# Patient Record
Sex: Female | Born: 1955 | Race: White | Hispanic: No | Marital: Married | Smoking: Never smoker
Health system: Southern US, Community
[De-identification: ages and names within clinical notes are randomized; demographics above are authoritative.]

## PROBLEM LIST (undated history)

## (undated) DIAGNOSIS — D4860 Neoplasm of uncertain behavior of unspecified breast: Secondary | ICD-10-CM

## (undated) DIAGNOSIS — G709 Myoneural disorder, unspecified: Secondary | ICD-10-CM

## (undated) DIAGNOSIS — G4733 Obstructive sleep apnea (adult) (pediatric): Secondary | ICD-10-CM

## (undated) DIAGNOSIS — F419 Anxiety disorder, unspecified: Secondary | ICD-10-CM

## (undated) DIAGNOSIS — N63 Unspecified lump in unspecified breast: Secondary | ICD-10-CM

## (undated) DIAGNOSIS — I10 Essential (primary) hypertension: Secondary | ICD-10-CM

## (undated) DIAGNOSIS — M1710 Unilateral primary osteoarthritis, unspecified knee: Secondary | ICD-10-CM

## (undated) DIAGNOSIS — R51 Headache: Secondary | ICD-10-CM

## (undated) DIAGNOSIS — M1990 Unspecified osteoarthritis, unspecified site: Secondary | ICD-10-CM

## (undated) DIAGNOSIS — M1711 Unilateral primary osteoarthritis, right knee: Secondary | ICD-10-CM

## (undated) DIAGNOSIS — N6452 Nipple discharge: Secondary | ICD-10-CM

## (undated) DIAGNOSIS — Z96652 Presence of left artificial knee joint: Secondary | ICD-10-CM

## (undated) DIAGNOSIS — E039 Hypothyroidism, unspecified: Secondary | ICD-10-CM

## (undated) DIAGNOSIS — R519 Headache, unspecified: Secondary | ICD-10-CM

## (undated) DIAGNOSIS — L409 Psoriasis, unspecified: Secondary | ICD-10-CM

## (undated) DIAGNOSIS — M1712 Unilateral primary osteoarthritis, left knee: Secondary | ICD-10-CM

## (undated) DIAGNOSIS — M797 Fibromyalgia: Secondary | ICD-10-CM

## (undated) HISTORY — PX: EYE SURGERY: SHX253

## (undated) HISTORY — DX: Unspecified osteoarthritis, unspecified site: M19.90

## (undated) HISTORY — DX: Unspecified lump in unspecified breast: N63.0

## (undated) HISTORY — DX: Essential (primary) hypertension: I10

## (undated) HISTORY — PX: BREAST BIOPSY: SHX20

## (undated) HISTORY — DX: Neoplasm of uncertain behavior of unspecified breast: D48.60

## (undated) SURGERY — Surgical Case
Anesthesia: *Unknown

---

## 1992-11-09 HISTORY — PX: CHOLECYSTECTOMY: SHX55

## 1997-11-09 HISTORY — PX: BREAST SURGERY: SHX581

## 1998-11-09 DIAGNOSIS — I10 Essential (primary) hypertension: Secondary | ICD-10-CM

## 1998-11-09 HISTORY — DX: Essential (primary) hypertension: I10

## 2004-11-09 HISTORY — PX: ENDOMETRIAL ABLATION: SHX621

## 2005-04-22 ENCOUNTER — Ambulatory Visit: Payer: Self-pay | Admitting: Orthopaedic Surgery

## 2005-06-04 ENCOUNTER — Other Ambulatory Visit: Payer: Self-pay

## 2005-06-09 ENCOUNTER — Ambulatory Visit: Payer: Self-pay | Admitting: Orthopaedic Surgery

## 2006-04-02 ENCOUNTER — Ambulatory Visit: Payer: Self-pay | Admitting: Internal Medicine

## 2006-11-09 DIAGNOSIS — M1990 Unspecified osteoarthritis, unspecified site: Secondary | ICD-10-CM

## 2006-11-09 HISTORY — DX: Unspecified osteoarthritis, unspecified site: M19.90

## 2008-03-07 ENCOUNTER — Ambulatory Visit: Payer: Self-pay | Admitting: General Practice

## 2008-11-21 ENCOUNTER — Ambulatory Visit: Payer: Self-pay | Admitting: Internal Medicine

## 2009-11-26 ENCOUNTER — Ambulatory Visit: Payer: Self-pay | Admitting: Internal Medicine

## 2009-12-31 ENCOUNTER — Ambulatory Visit: Payer: Self-pay | Admitting: Internal Medicine

## 2010-01-21 ENCOUNTER — Encounter: Payer: Self-pay | Admitting: Internal Medicine

## 2010-02-07 ENCOUNTER — Encounter: Payer: Self-pay | Admitting: Internal Medicine

## 2010-03-09 ENCOUNTER — Encounter: Payer: Self-pay | Admitting: Internal Medicine

## 2010-05-17 ENCOUNTER — Inpatient Hospital Stay: Payer: Self-pay | Admitting: Internal Medicine

## 2011-01-05 ENCOUNTER — Ambulatory Visit: Payer: Self-pay | Admitting: Internal Medicine

## 2012-09-08 ENCOUNTER — Ambulatory Visit: Payer: Self-pay | Admitting: Gastroenterology

## 2012-09-08 HISTORY — PX: COLONOSCOPY: SHX174

## 2012-11-09 HISTORY — PX: BREAST BIOPSY: SHX20

## 2012-11-09 HISTORY — PX: BREAST SURGERY: SHX581

## 2012-12-27 ENCOUNTER — Ambulatory Visit: Payer: Self-pay | Admitting: Physician Assistant

## 2013-01-10 ENCOUNTER — Ambulatory Visit: Payer: Self-pay | Admitting: Anesthesiology

## 2013-01-13 ENCOUNTER — Ambulatory Visit: Payer: Self-pay | Admitting: General Surgery

## 2013-02-08 ENCOUNTER — Telehealth: Payer: Self-pay | Admitting: General Surgery

## 2013-02-08 NOTE — Telephone Encounter (Addendum)
Melissa Hunt has requested a "re-read" of her slides by a second pathologist.  She works at [employer] and understands that it was [employer] folks at the hospital who read them, but would still like a second opinion.  I told her I would let you know and either you or I would get back to her.   I placed a return call to [name] on 02-10-13 at 9:00 a.m.  Left message on her voicemail to call office.  [name] requested that Dr. Meridee Score take another look.  [name] called it a QA.  I told her that I would let you know and if there was a problem, we would get back in touch with her.

## 2013-02-09 NOTE — Telephone Encounter (Signed)
Called pt, got voicemail, left message to call back. Her path slides were reviewed by other pathologists at [facility]- this their practice. If pt requests review from other location would like to know where.Melissa Hunt

## 2013-02-13 ENCOUNTER — Ambulatory Visit: Payer: Self-pay | Admitting: Internal Medicine

## 2013-02-14 ENCOUNTER — Telehealth: Payer: Self-pay | Admitting: *Deleted

## 2013-02-14 NOTE — Telephone Encounter (Signed)
Left message for patient to call office.  Per Dr. Evette Cristal, there are 4 [facility] contracted pathologist that have looked at her specimen and all agree with the same diagnosis.

## 2013-03-06 ENCOUNTER — Other Ambulatory Visit: Payer: Self-pay | Admitting: *Deleted

## 2013-03-06 ENCOUNTER — Encounter: Payer: Self-pay | Admitting: General Surgery

## 2013-03-06 ENCOUNTER — Ambulatory Visit (INDEPENDENT_AMBULATORY_CARE_PROVIDER_SITE_OTHER): Payer: 59 | Admitting: General Surgery

## 2013-03-06 VITALS — BP 126/62 | HR 68 | Resp 16 | Ht 64.0 in | Wt 240.0 lb

## 2013-03-06 DIAGNOSIS — D4860 Neoplasm of uncertain behavior of unspecified breast: Secondary | ICD-10-CM | POA: Insufficient documentation

## 2013-03-06 DIAGNOSIS — D4861 Neoplasm of uncertain behavior of right breast: Secondary | ICD-10-CM

## 2013-03-06 MED ORDER — TAMOXIFEN CITRATE 20 MG PO TABS: 20.0000 mg | ORAL_TABLET | Freq: Every day | ORAL | Status: DC

## 2013-03-06 NOTE — Progress Notes (Signed)
Patient ID: Melissa Hunt, female   DOB: May 12, 1956, 57 y.o.   MRN: [id number]  Chief Complaint  Patient presents with  . Follow-up    6 week follow up no mammogram    HPI Melissa Hunt is a 57 y.o. female here today following up from here 6 week left breast biopsy done on 01/13/13. Patient reports reduced swelling at biopsy site. Does report some mild soreness with pressure at this site.  HPI  Past Medical History  Diagnosis Date  . Arthritis 2008  . Hypertension 2000  . Neoplasm of uncertain behavior of breast   . Lump or mass in breast     Past Surgical History  Procedure Laterality Date  . Breast surgery Left 2014    breast biopsy  . Breast surgery Right 1999    excision of mass  . Endometrial ablation  2006  . Colonoscopy  2013  . Cholecystectomy  1994  . Cesarean section  1985    Family History  Problem Relation Age of Onset  . Cancer Father   . Cancer Paternal Aunt     liver  . Cancer Maternal Grandmother     lung    Social History History  Substance Use Topics  . Smoking status: Never Smoker   . Smokeless tobacco: Never Used  . Alcohol Use: 0.6 oz/week    1 Glasses of wine per week    No Known Allergies  Current Outpatient Prescriptions  Medication Sig Dispense Refill  . Clobetasol Propionate (TEMOVATE) 0.05 % external spray Apply topically 2 (two) times daily.      . cyclobenzaprine (FLEXERIL) 10 MG tablet Take 10 mg by mouth as needed for muscle spasms.      . DULoxetine (CYMBALTA) 60 MG capsule Take 60 mg by mouth daily.      Marland Kitchen levothyroxine (SYNTHROID, LEVOTHROID) 175 MCG tablet Take 175 mcg by mouth daily before breakfast.      . olmesartan (BENICAR) 20 MG tablet Take 20 mg by mouth daily.      . traZODone (DESYREL) 100 MG tablet Take 100 mg by mouth at bedtime.      . tamoxifen (NOLVADEX) 20 MG tablet Take 1 tablet (20 mg total) by mouth daily.  30 tablet  12   No current facility-administered medications for this visit.    Review of  Systems Review of Systems  Constitutional: Negative.   Respiratory: Negative.   Cardiovascular: Negative.     Blood pressure 126/62, pulse 68, resp. rate 16, height 5\' 4"  (1.626 m), weight 240 lb (108.863 kg).  Physical Exam Physical Exam  Constitutional: She appears well-developed and well-nourished.  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 Pt has decided to take this .        SANKAR,SEEPLAPUTHUR G 03/06/2013, 8:11 PM

## 2013-03-06 NOTE — Patient Instructions (Addendum)
Return in August 2014.

## 2013-03-06 NOTE — Progress Notes (Signed)
The patient has been asked to return to the office in four months for a unilateral left breast diagnostic mammogram.

## 2013-03-09 ENCOUNTER — Ambulatory Visit: Payer: Self-pay | Admitting: Internal Medicine

## 2013-06-27 ENCOUNTER — Ambulatory Visit: Payer: Self-pay | Admitting: General Surgery

## 2013-06-28 ENCOUNTER — Encounter: Payer: Self-pay | Admitting: General Surgery

## 2013-07-04 ENCOUNTER — Ambulatory Visit (INDEPENDENT_AMBULATORY_CARE_PROVIDER_SITE_OTHER): Payer: 59 | Admitting: General Surgery

## 2013-07-04 ENCOUNTER — Encounter: Payer: Self-pay | Admitting: General Surgery

## 2013-07-04 VITALS — BP 134/76 | HR 82 | Resp 12 | Wt 230.0 lb

## 2013-07-04 DIAGNOSIS — D4860 Neoplasm of uncertain behavior of unspecified breast: Secondary | ICD-10-CM

## 2013-07-04 DIAGNOSIS — D4862 Neoplasm of uncertain behavior of left breast: Secondary | ICD-10-CM

## 2013-07-04 MED ORDER — TAMOXIFEN CITRATE 20 MG PO TABS: 20.0000 mg | ORAL_TABLET | Freq: Every day | ORAL | Status: DC

## 2013-07-04 NOTE — Progress Notes (Signed)
Patient ID: Melissa Hunt, female   DOB: 28-Oct-1956, 57 y.o.   MRN: [id number]  No chief complaint on file.   HPI Melissa Hunt is a 57 y.o. female who presents for a breast evaluation.The most recent mammogram was done at [facility] on 06/27/13 it was a unilateral left. Patient does perform regular Hunt breast checks and gets regular mammograms done. Patient had a left breast biopsy done on 01/13/13. 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  She has noticed that she has been bruising easily for the past 5 months. No reports of excessive bleeding. She also reports hot flashes.  HPI  Past Medical History  Diagnosis Date  . Arthritis 2008  . Hypertension 2000  . Neoplasm of uncertain behavior of breast   . Lump or mass in breast     Past Surgical History  Procedure Laterality Date  . Endometrial ablation  2006  . Colonoscopy  2013  . Cholecystectomy  1994  . Cesarean section  1985  . Breast surgery Left 2014    breast biopsy  . Breast surgery Right 1999    excision of mass    Family History  Problem Relation Age of Onset  . Cancer Father   . Cancer Paternal Aunt     liver  . Cancer Maternal Grandmother     lung    Social History History  Substance Use Topics  . Smoking status: Never Smoker   . Smokeless tobacco: Never Used  . Alcohol Use: 0.6 oz/week    1 Glasses of wine per week    No Known Allergies  Current Outpatient Prescriptions  Medication Sig Dispense Refill  . Cholecalciferol (VITAMIN D3) 2000 UNITS TABS Take 1 capsule by mouth daily.      . Clobetasol Propionate (TEMOVATE) 0.05 % external spray Apply topically 2 (two) times daily.      . cyclobenzaprine (FLEXERIL) 10 MG tablet Take 10 mg by mouth as needed for muscle spasms.      . DULoxetine (CYMBALTA) 60 MG capsule Take 60 mg by mouth daily.      . hydrochlorothiazide (HYDRODIURIL) 25 MG tablet       . levothyroxine (SYNTHROID, LEVOTHROID) 175 MCG tablet Take 175 mcg by mouth daily before breakfast.       . olmesartan (BENICAR) 20 MG tablet Take 20 mg by mouth daily.      . tamoxifen (NOLVADEX) 20 MG tablet Take 1 tablet (20 mg total) by mouth daily.  30 tablet  12  . traZODone (DESYREL) 100 MG tablet Take 100 mg by mouth at bedtime.      . tamoxifen (NOLVADEX) 20 MG tablet Take 1 tablet (20 mg total) by mouth daily.  90 tablet  4   No current facility-administered medications for this visit.    Review of Systems Review of Systems  Constitutional: Negative.   Respiratory: Negative.   Cardiovascular: Negative.     Blood pressure 134/76, pulse 82, resp. rate 12, weight 230 lb (104.327 kg).  Physical Exam Physical Exam  Constitutional: She is oriented to person, place, and time. She appears well-developed and well-nourished.  Eyes: Conjunctivae are normal. No scleral icterus.  Neck: Neck supple.  Cardiovascular: Normal rate, regular rhythm and normal heart sounds.   Pulmonary/Chest: Effort normal and breath sounds normal. Right breast exhibits no inverted nipple, no mass, no nipple discharge, no skin change and no tenderness. Left breast exhibits no inverted nipple, no mass, no nipple discharge, no skin change and no tenderness.  Abdominal:  Soft. There is hepatosplenomegaly. There is no tenderness.  Lymphadenopathy:    She has no cervical adenopathy.    She has no axillary adenopathy.  Neurological: She is alert and oriented to person, place, and time.    Data Reviewed Mammogram left stable  Assessment    ADH, no new findings     Plan    To continue Tamoxifen, new Rx sent        [facility] G 07/04/2013, 8:48 PM

## 2013-12-28 ENCOUNTER — Ambulatory Visit: Payer: Self-pay | Admitting: General Surgery

## 2013-12-29 ENCOUNTER — Encounter: Payer: Self-pay | Admitting: General Surgery

## 2014-01-01 ENCOUNTER — Ambulatory Visit: Payer: 59 | Admitting: General Surgery

## 2014-01-09 ENCOUNTER — Ambulatory Visit (INDEPENDENT_AMBULATORY_CARE_PROVIDER_SITE_OTHER): Payer: 59 | Admitting: General Surgery

## 2014-01-09 ENCOUNTER — Encounter: Payer: Self-pay | Admitting: General Surgery

## 2014-01-09 VITALS — BP 122/82 | HR 68 | Resp 12 | Ht 64.0 in | Wt 222.0 lb

## 2014-01-09 DIAGNOSIS — N6089 Other benign mammary dysplasias of unspecified breast: Secondary | ICD-10-CM

## 2014-01-09 DIAGNOSIS — N6099 Unspecified benign mammary dysplasia of unspecified breast: Secondary | ICD-10-CM

## 2014-01-09 NOTE — Progress Notes (Signed)
Patient ID: Melissa Hunt, female   DOB: 06/02/1956, 58 y.o.   MRN: [id number]  Chief Complaint  Patient presents with  . Follow-up    6 month follow up bilateral diagnostic mammogram    HPI Melissa Hunt is a 58 y.o. female who presents for a breast evaluation. The most recent mammogram was done on 12/28/13. Patient does perform regular self breast checks and gets regular mammograms done. The patient denies any new problems at this time with her breasts.   Patient is one year post excision of ADH.   HPI  Past Medical History  Diagnosis Date  . Arthritis 2008  . Hypertension 2000  . Neoplasm of uncertain behavior of breast   . Lump or mass in breast     Past Surgical History  Procedure Laterality Date  . Endometrial ablation  2006  . Colonoscopy  2013  . Cholecystectomy  1994  . Cesarean section  1985  . Breast surgery Left 2014    breast biopsy  . Breast surgery Right 1999    excision of mass    Family History  Problem Relation Age of Onset  . Cancer Father   . Cancer Paternal Aunt     liver  . Cancer Maternal Grandmother     lung    Social History History  Substance Use Topics  . Smoking status: Never Smoker   . Smokeless tobacco: Never Used  . Alcohol Use: 0.6 oz/week    1 Glasses of wine per week    No Known Allergies  Current Outpatient Prescriptions  Medication Sig Dispense Refill  . Cholecalciferol (VITAMIN D3) 2000 UNITS TABS Take 1 capsule by mouth daily.      . Clobetasol Propionate (TEMOVATE) 0.05 % external spray Apply topically 2 (two) times daily.      . cyclobenzaprine (FLEXERIL) 10 MG tablet Take 10 mg by mouth as needed for muscle spasms.      . DULoxetine (CYMBALTA) 60 MG capsule Take 60 mg by mouth daily.      . hydrochlorothiazide (HYDRODIURIL) 25 MG tablet       . levothyroxine (SYNTHROID, LEVOTHROID) 175 MCG tablet Take 175 mcg by mouth daily before breakfast.      . olmesartan (BENICAR) 20 MG tablet Take 20 mg by mouth daily.      .  tamoxifen (NOLVADEX) 20 MG tablet Take 1 tablet (20 mg total) by mouth daily.  30 tablet  12  . traZODone (DESYREL) 100 MG tablet Take 100 mg by mouth at bedtime.       No current facility-administered medications for this visit.    Review of Systems Review of Systems  Constitutional: Negative.   Respiratory: Negative.   Cardiovascular: Negative.     Blood pressure 122/82, pulse 68, resp. rate 12, height 5\' 4"  (1.626 m), weight 222 lb (100.699 kg).  Physical Exam Physical Exam  Constitutional: She is oriented to person, place, and time. She appears well-developed and well-nourished.  Eyes: No scleral icterus.  Neck: Neck supple.  Cardiovascular: Normal rate, regular rhythm and normal heart sounds.   Pulmonary/Chest: Effort normal and breath sounds normal. Right breast exhibits no inverted nipple, no mass, no nipple discharge, no skin change and no tenderness. Left breast exhibits no inverted nipple, no mass, no nipple discharge, no skin change and no tenderness.  Lymphadenopathy:    She has no cervical adenopathy.    She has no axillary adenopathy.  Neurological: She is alert and oriented to person, place, and time.  Skin: Skin is warm and dry.    Data Reviewed Mammogram reviewed and stable  Assessment    Stable physical exam. ADH, now on Tamoxifen.    Plan    Follow up in one year bilateral screening mammogram and office visit.       Trine Fread G 01/11/2014, 5:58 AM

## 2014-01-09 NOTE — Patient Instructions (Signed)
Continue self breast exams. Call office for any new breast issues or concerns. 

## 2014-01-11 ENCOUNTER — Encounter: Payer: Self-pay | Admitting: General Surgery

## 2014-01-11 DIAGNOSIS — N6099 Unspecified benign mammary dysplasia of unspecified breast: Secondary | ICD-10-CM | POA: Insufficient documentation

## 2014-06-21 ENCOUNTER — Other Ambulatory Visit: Payer: Self-pay | Admitting: General Surgery

## 2014-09-10 ENCOUNTER — Encounter: Payer: Self-pay | Admitting: General Surgery

## 2014-12-31 ENCOUNTER — Ambulatory Visit: Payer: Self-pay | Admitting: Internal Medicine

## 2015-01-04 ENCOUNTER — Encounter: Payer: Self-pay | Admitting: Podiatry

## 2015-01-04 ENCOUNTER — Ambulatory Visit (INDEPENDENT_AMBULATORY_CARE_PROVIDER_SITE_OTHER): Payer: Commercial Managed Care - PPO | Admitting: Podiatry

## 2015-01-04 ENCOUNTER — Ambulatory Visit (INDEPENDENT_AMBULATORY_CARE_PROVIDER_SITE_OTHER): Payer: Commercial Managed Care - PPO

## 2015-01-04 VITALS — BP 120/75 | HR 93 | Resp 16

## 2015-01-04 DIAGNOSIS — M898X9 Other specified disorders of bone, unspecified site: Secondary | ICD-10-CM

## 2015-01-04 DIAGNOSIS — M2040 Other hammer toe(s) (acquired), unspecified foot: Secondary | ICD-10-CM

## 2015-01-04 DIAGNOSIS — L84 Corns and callosities: Secondary | ICD-10-CM

## 2015-01-04 NOTE — Progress Notes (Signed)
   Subjective:    Patient ID: Melissa Hunt, female    DOB: 18-May-1956, 59 y.o.   MRN: [id number]  HPI Comments: "I think I have a wart"  Patient c/o tender 4th toe left for several months. The area is callused. No home treatment.  Also, callused areas sub 5th MPJ bilateral and medial 1st toes bilateral   Toe Pain       Review of Systems  All other systems reviewed and are negative.      Objective:   Physical Exam        Assessment & Plan:

## 2015-01-05 NOTE — Progress Notes (Signed)
Subjective:     Patient ID: Melissa Hunt, female   DOB: 01-18-1956, 59 y.o.   MRN: [id number]  HPI patient states she's had a lot of problems with the end of the fourth toe left being painful and also the fourth toe left and has lesions underneath the fifth metatarsals of both feet which bother her   Review of Systems  All other systems reviewed and are negative.      Objective:   Physical Exam  Constitutional: She is oriented to person, place, and time.  Cardiovascular: Intact distal pulses.   Musculoskeletal: Normal range of motion.  Neurological: She is oriented to person, place, and time.  Skin: Skin is warm.  Nursing note and vitals reviewed.  neurovascular status intact with muscle strength adequate and range of motion subtalar midtarsal joint within normal limits. Patient's noted to have distal keratotic lesion fourth toe left that is painful when pressed and is noted to have keratotic lesions fifth digit 2 it's and fifth metatarsal of both feet the lesions on the fifth metatarsal being painful. 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 Debrided lesions today and explained that this will be done routinely and that if the symptoms get too bad we will need to consider surgical intervention in this particular case

## 2015-01-08 ENCOUNTER — Encounter: Payer: Self-pay | Admitting: General Surgery

## 2015-01-08 ENCOUNTER — Ambulatory Visit (INDEPENDENT_AMBULATORY_CARE_PROVIDER_SITE_OTHER): Payer: Commercial Managed Care - PPO | Admitting: General Surgery

## 2015-01-08 VITALS — BP 122/68 | HR 80 | Resp 12 | Ht 64.0 in | Wt 222.0 lb

## 2015-01-08 DIAGNOSIS — N62 Hypertrophy of breast: Secondary | ICD-10-CM

## 2015-01-08 DIAGNOSIS — N6099 Unspecified benign mammary dysplasia of unspecified breast: Secondary | ICD-10-CM

## 2015-01-08 NOTE — Patient Instructions (Addendum)
The patient has been asked to return to the office in one year with a bilateral screening mammogram. Continue self breast exams. Call office for any new breast issues or concerns.  

## 2015-01-08 NOTE — Progress Notes (Signed)
Patient ID: Melissa Hunt, female   DOB: 19-Jun-1956, 59 y.o.   MRN: [id number]  Chief Complaint  Patient presents with  . Follow-up    mammogram    HPI Melissa Hunt is a 59 y.o. female who presents for a breast evaluation. The most recent mammogram was done on 12/31/14 .  Patient does perform regular self breast checks and gets regular mammograms done.  No new breast complaints. 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  HPI  Past Medical History  Diagnosis Date  . Arthritis 2008  . Hypertension 2000  . Neoplasm of uncertain behavior of breast   . Lump or mass in breast     Past Surgical History  Procedure Laterality Date  . Endometrial ablation  2006  . Colonoscopy  2013  . Cholecystectomy  1994  . Cesarean section  1985  . Breast surgery Left 2014    breast biopsy/Atypical ductal hyperplasia of breast   . Breast surgery Right 1999    excision of mass    Family History  Problem Relation Age of Onset  . Cancer Father   . Cancer Paternal Aunt     liver  . Cancer Maternal Grandmother     lung    Social History History  Substance Use Topics  . Smoking status: Never Smoker   . Smokeless tobacco: Never Used  . Alcohol Use: 0.6 oz/week    1 Glasses of wine per week    No Known Allergies  Current Outpatient Prescriptions  Medication Sig Dispense Refill  . Cholecalciferol (VITAMIN D3) 2000 UNITS TABS Take 1 capsule by mouth daily.    Marland Kitchen levothyroxine (SYNTHROID, LEVOTHROID) 175 MCG tablet Take 175 mcg by mouth daily before breakfast.    . olmesartan (BENICAR) 20 MG tablet Take 20 mg by mouth daily.    . tamoxifen (NOLVADEX) 20 MG tablet Take 1 tablet by mouth  daily 90 tablet 4   No current facility-administered medications for this visit.    Review of Systems Review of Systems  Constitutional: Negative.   Respiratory: Negative.   Cardiovascular: Negative.     Blood pressure 122/68, pulse 80, resp. rate 12, height 5\' 4"   (1.626 m), weight 222 lb (100.699 kg).  Physical Exam Physical Exam  Constitutional: She is oriented to person, place, and time. She appears well-developed and well-nourished.  Eyes: Conjunctivae are normal. No scleral icterus.  Neck: Neck supple.  Cardiovascular: Normal rate, regular rhythm and normal heart sounds.   Pulmonary/Chest: Effort normal and breath sounds normal. Right breast exhibits no inverted nipple, no mass, no nipple discharge, no skin change and no tenderness. Left breast exhibits no inverted nipple, no mass, no nipple discharge, no skin change and no tenderness.  Abdominal: Soft. Bowel sounds are normal. There is no tenderness.  Lymphadenopathy:    She has no cervical adenopathy.    She has no axillary adenopathy.  Neurological: She is alert and oriented to person, place, and time.  Skin: Skin is warm and dry.    Data Reviewed Mammogram reviewed and stable  Assessment    Stable exam. ADH left breast 32yrs post excision. On preventive Tamoxifen.    Plan    The patient has been asked to return to the office in one year with a bilateral screening mammogram.        Joaquin Knebel G 01/09/2015, 1:17 PM

## 2015-01-09 ENCOUNTER — Encounter: Payer: Self-pay | Admitting: General Surgery

## 2015-03-01 NOTE — Op Note (Signed)
PATIENT NAME:  BERRY, GALLACHER MR#:  [id number] DATE OF BIRTH:  [birth year]  DATE OF PROCEDURE:  01/13/2013  PREOPERATIVE DIAGNOSIS: Left nipple bloody discharge with associated subareolar mass.   POSTOPERATIVE DIAGNOSIS: Left nipple bloody discharge with associated subareolar mass.  OPERATION: Subareolar duct excision and ultrasound guidance intraoperatively.   SURGEON: S.G. Jamal Collin, MD   ANESTHESIA: General.   COMPLICATIONS: None.   ESTIMATED BLOOD LOSS: Minimal.   DRAINS: None.   DESCRIPTION OF PROCEDURE: The patient was put to sleep with an LMA, and the left breast was prepped and draped out as a sterile field. Ultrasound probe was brought up to the field, and the previously noted small hypoechoic area adjacent to the nipple was identified in the outer aspect about the 2 to 3 o'clock position. It was felt that this will be adequately included in the subareolar excision. A local anesthetic of 0.5% Marcaine was instilled, and incision was made from the 1 o'clock to the 4 o'clock position circumareolar. The areolar skin was lifted up all the way to the nipple, and the skin and subcutaneous tissue was also elevated on the lateral aspect. The subareolar tissue all the way up to the nipple area was then completely excised out. The excised tissue was queried with the ultrasound probe and showed the tiny hypoechoic area within the midst of it. Grossly, this was not a palpable mass. The rest of the underlying breast tissue appeared to be normal. After ensuring hemostasis, the deeper tissues were approximated with 2-0 Vicryl, and the skin closed with subcuticular 4-0 Vicryl, covered with Dermabond. The procedure was well tolerated. She was subsequently returned to the recovery room in stable condition.   ____________________________ S.Robinette Haines, MD sgs:OSi D: 01/14/2013 08:47:28 ET T: 01/14/2013 13:12:01 ET JOB#: [id number]  cc: Synthia Innocent. Jamal Collin, MD, <Dictator> [facility] Robinette Haines MD ELECTRONICALLY  SIGNED 01/15/2013 12:37

## 2015-07-22 DIAGNOSIS — E039 Hypothyroidism, unspecified: Secondary | ICD-10-CM | POA: Diagnosis present

## 2015-10-23 ENCOUNTER — Other Ambulatory Visit: Payer: Self-pay

## 2015-10-23 DIAGNOSIS — Z1231 Encounter for screening mammogram for malignant neoplasm of breast: Secondary | ICD-10-CM

## 2016-01-03 ENCOUNTER — Ambulatory Visit
Admission: RE | Admit: 2016-01-03 | Discharge: 2016-01-03 | Disposition: A | Discharge status: Home / Self Care | Payer: 59 | Source: Ambulatory Visit | Attending: General Surgery | Admitting: General Surgery

## 2016-01-03 DIAGNOSIS — Z1231 Encounter for screening mammogram for malignant neoplasm of breast: Secondary | ICD-10-CM | POA: Diagnosis not present

## 2016-01-08 ENCOUNTER — Ambulatory Visit: Payer: Commercial Managed Care - PPO | Admitting: General Surgery

## 2016-03-05 ENCOUNTER — Encounter: Payer: Self-pay | Admitting: *Deleted

## 2016-07-22 DIAGNOSIS — E782 Mixed hyperlipidemia: Secondary | ICD-10-CM | POA: Diagnosis present

## 2017-02-15 ENCOUNTER — Other Ambulatory Visit: Payer: Self-pay | Admitting: Internal Medicine

## 2017-02-15 DIAGNOSIS — N6452 Nipple discharge: Secondary | ICD-10-CM

## 2017-02-23 ENCOUNTER — Ambulatory Visit
Admission: RE | Admit: 2017-02-23 | Discharge: 2017-02-23 | Disposition: A | Discharge status: Home / Self Care | Payer: 59 | Source: Ambulatory Visit | Attending: Internal Medicine | Admitting: Internal Medicine

## 2017-02-23 DIAGNOSIS — N6452 Nipple discharge: Secondary | ICD-10-CM | POA: Insufficient documentation

## 2017-02-23 HISTORY — DX: Nipple discharge: N64.52

## 2017-02-25 ENCOUNTER — Other Ambulatory Visit: Payer: Self-pay | Admitting: Internal Medicine

## 2017-02-25 DIAGNOSIS — N6452 Nipple discharge: Secondary | ICD-10-CM

## 2017-03-05 ENCOUNTER — Ambulatory Visit
Admission: RE | Admit: 2017-03-05 | Discharge: 2017-03-05 | Disposition: A | Discharge status: Home / Self Care | Payer: 59 | Source: Ambulatory Visit | Attending: Internal Medicine | Admitting: Internal Medicine

## 2017-03-05 DIAGNOSIS — N6452 Nipple discharge: Secondary | ICD-10-CM | POA: Diagnosis not present

## 2017-03-05 MED ORDER — GADOBENATE DIMEGLUMINE 529 MG/ML IV SOLN
20.0000 mL | Freq: Once | INTRAVENOUS | Status: AC | PRN
  Administered 2017-03-05: 20 mL via INTRAVENOUS

## 2017-03-09 ENCOUNTER — Other Ambulatory Visit: Payer: Self-pay | Admitting: Internal Medicine

## 2017-03-09 DIAGNOSIS — N63 Unspecified lump in unspecified breast: Secondary | ICD-10-CM

## 2017-03-10 ENCOUNTER — Other Ambulatory Visit: Payer: Self-pay | Admitting: Internal Medicine

## 2017-03-10 DIAGNOSIS — N63 Unspecified lump in unspecified breast: Secondary | ICD-10-CM

## 2017-03-10 DIAGNOSIS — N6452 Nipple discharge: Secondary | ICD-10-CM

## 2017-03-12 ENCOUNTER — Ambulatory Visit
Admission: RE | Admit: 2017-03-12 | Discharge: 2017-03-12 | Disposition: A | Discharge status: Home / Self Care | Payer: 59 | Source: Ambulatory Visit | Attending: Internal Medicine | Admitting: Internal Medicine

## 2017-03-12 DIAGNOSIS — N6452 Nipple discharge: Secondary | ICD-10-CM

## 2017-03-12 DIAGNOSIS — N6012 Diffuse cystic mastopathy of left breast: Secondary | ICD-10-CM | POA: Diagnosis not present

## 2017-03-12 DIAGNOSIS — N6489 Other specified disorders of breast: Secondary | ICD-10-CM | POA: Diagnosis not present

## 2017-03-12 DIAGNOSIS — N63 Unspecified lump in unspecified breast: Secondary | ICD-10-CM

## 2017-03-12 HISTORY — PX: BREAST BIOPSY: SHX20

## 2017-03-12 MED ORDER — GADOBENATE DIMEGLUMINE 529 MG/ML IV SOLN
20.0000 mL | Freq: Once | INTRAVENOUS | Status: AC | PRN
  Administered 2017-03-12: 20 mL via INTRAVENOUS

## 2017-03-24 DIAGNOSIS — N6012 Diffuse cystic mastopathy of left breast: Secondary | ICD-10-CM | POA: Diagnosis not present

## 2017-03-24 DIAGNOSIS — N6452 Nipple discharge: Secondary | ICD-10-CM | POA: Diagnosis not present

## 2017-05-13 DIAGNOSIS — M25561 Pain in right knee: Secondary | ICD-10-CM | POA: Diagnosis not present

## 2017-05-13 DIAGNOSIS — M25562 Pain in left knee: Secondary | ICD-10-CM | POA: Diagnosis not present

## 2017-05-13 DIAGNOSIS — M170 Bilateral primary osteoarthritis of knee: Secondary | ICD-10-CM | POA: Diagnosis not present

## 2017-05-20 DIAGNOSIS — M170 Bilateral primary osteoarthritis of knee: Secondary | ICD-10-CM | POA: Diagnosis not present

## 2017-05-20 DIAGNOSIS — M160 Bilateral primary osteoarthritis of hip: Secondary | ICD-10-CM | POA: Diagnosis not present

## 2017-06-17 DIAGNOSIS — G5603 Carpal tunnel syndrome, bilateral upper limbs: Secondary | ICD-10-CM | POA: Diagnosis not present

## 2017-06-17 DIAGNOSIS — M47816 Spondylosis without myelopathy or radiculopathy, lumbar region: Secondary | ICD-10-CM | POA: Diagnosis not present

## 2017-06-17 DIAGNOSIS — M1711 Unilateral primary osteoarthritis, right knee: Secondary | ICD-10-CM | POA: Diagnosis not present

## 2017-06-24 DIAGNOSIS — M545 Low back pain: Secondary | ICD-10-CM | POA: Diagnosis not present

## 2017-06-30 DIAGNOSIS — M47816 Spondylosis without myelopathy or radiculopathy, lumbar region: Secondary | ICD-10-CM | POA: Diagnosis not present

## 2017-07-19 DIAGNOSIS — M545 Low back pain: Secondary | ICD-10-CM | POA: Diagnosis not present

## 2017-07-19 DIAGNOSIS — M5416 Radiculopathy, lumbar region: Secondary | ICD-10-CM | POA: Diagnosis not present

## 2017-07-28 DIAGNOSIS — Z0000 Encounter for general adult medical examination without abnormal findings: Secondary | ICD-10-CM | POA: Diagnosis not present

## 2017-07-28 DIAGNOSIS — N6452 Nipple discharge: Secondary | ICD-10-CM | POA: Diagnosis not present

## 2017-08-04 DIAGNOSIS — M1712 Unilateral primary osteoarthritis, left knee: Secondary | ICD-10-CM | POA: Diagnosis not present

## 2017-08-04 DIAGNOSIS — M5416 Radiculopathy, lumbar region: Secondary | ICD-10-CM | POA: Diagnosis not present

## 2017-08-09 ENCOUNTER — Encounter: Payer: Self-pay | Admitting: *Deleted

## 2017-08-09 ENCOUNTER — Ambulatory Visit: Payer: Commercial Managed Care - PPO | Admitting: General Surgery

## 2017-08-10 DIAGNOSIS — M25562 Pain in left knee: Secondary | ICD-10-CM | POA: Diagnosis not present

## 2017-08-11 ENCOUNTER — Inpatient Hospital Stay: Payer: Self-pay

## 2017-08-11 ENCOUNTER — Ambulatory Visit (INDEPENDENT_AMBULATORY_CARE_PROVIDER_SITE_OTHER): Payer: 59 | Admitting: General Surgery

## 2017-08-11 ENCOUNTER — Encounter: Payer: Self-pay | Admitting: General Surgery

## 2017-08-11 VITALS — BP 128/70 | HR 94 | Resp 14 | Ht 63.0 in | Wt 210.0 lb

## 2017-08-11 DIAGNOSIS — N6489 Other specified disorders of breast: Secondary | ICD-10-CM

## 2017-08-11 DIAGNOSIS — N62 Hypertrophy of breast: Secondary | ICD-10-CM

## 2017-08-11 DIAGNOSIS — N6452 Nipple discharge: Secondary | ICD-10-CM

## 2017-08-11 NOTE — Progress Notes (Signed)
Patient ID: Melissa Hunt, female   DOB: [birth year], 61 y.o.   MRN: [id number]  Chief Complaint  Patient presents with  . Follow-up    HPI Melissa Hunt is a 61 y.o. female.  who presents for a breast evaluation and second opinion. She states she saw Dr Viona Gilmore. Tamala Julian and he didn't feel anything needed to be done immediatly but she is concerned. He is a Industrial/product designer of hers. The most recent mammogram and biopsy was done on 03-12-17, showing Melissa Hunt Patient does perform regular self breast checks and gets regular mammograms done.   She states the left nipple started draining in February from 2 aresa. She states it started milky bloody in color now its clear. The patient underwent a left breast biopsy were nipple drainage in 2014 with findings of atypical ductal hyperplasia.  She was on Tamoxifen for chemoprevention for 2 years but stopped this medication over a year ago due to vasomotor symptoms. She last saw Dr Jamal Collin in 2016.  She has some orthopedic issues and using a cane today. 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  After the patient's 2014 biopsy she had contacted her treating surgeon at that time to be sure the slides had been reviewed by multiple pathologists.  HPI  Past Medical History:  Diagnosis Date  . Arthritis 2008  . Breast discharge 2 weeks ago   LEFT BREAST BLOODY  . Hypertension 2000  . Lump or mass in breast   . Neoplasm of uncertain behavior of breast     Past Surgical History:  Procedure Laterality Date  . BREAST BIOPSY Left 2014   neg, but took Tamoxifen 1 1/2 yrs  . BREAST BIOPSY Right    many years ago  . BREAST BIOPSY Left 03/12/2017   FIBROCYSTIC CHANGES WITH CALCIFICATIONS.[place]  . BREAST SURGERY Left 2014   breast biopsy/Atypical ductal hyperplasia of breast   . BREAST SURGERY Right 1999   excision of mass  . CESAREAN SECTION  1985  . CHOLECYSTECTOMY  1994  . COLONOSCOPY  2013   Dr Candace Cruise  . ENDOMETRIAL ABLATION  2006    Family History   Problem Relation Age of Onset  . Cancer Father 64       colon  . Cancer Paternal Aunt        liver  . Cancer Maternal Grandmother        lung  . Breast cancer Maternal Aunt   . Breast cancer Paternal Aunt     Social History Social History  Substance Use Topics  . Smoking status: Never Smoker  . Smokeless tobacco: Never Used  . Alcohol use 0.6 oz/week    1 Glasses of wine per week    No Known Allergies  Current Outpatient Prescriptions  Medication Sig Dispense Refill  . Cholecalciferol (VITAMIN D3) 2000 UNITS TABS Take 1 capsule by mouth daily.    Marland Kitchen FLUoxetine (PROZAC) 20 MG tablet Take 20 mg by mouth daily.    Marland Kitchen levothyroxine (SYNTHROID, LEVOTHROID) 150 MCG tablet Take 150 mcg by mouth daily before breakfast.     . olmesartan (BENICAR) 20 MG tablet Take 20 mg by mouth daily.    Marland Kitchen tiZANidine (ZANAFLEX) 4 MG tablet TAKE 1/2 TO 1 TABLET BY MOUTH TWICE A DAY AS NEEDED FOR SPASM (MAY CAUSE DROWSINESS) (MAIL REQ)  1  . traMADol (ULTRAM) 50 MG tablet Take 50 mg by mouth daily.      No current facility-administered medications for this visit.  Review of Systems Review of Systems  Constitutional: Negative.   Respiratory: Negative.   Cardiovascular: Negative.     Blood pressure 128/70, pulse 94, resp. rate 14, height '5\' 3"'  (1.6 m), weight 210 lb (95.3 kg).  Physical Exam Physical Exam  Constitutional: She is oriented to person, place, and time. She appears well-developed and well-nourished.  HENT:  Mouth/Throat: Oropharynx is clear and moist.  Eyes: Conjunctivae are normal. No scleral icterus.  Neck: Neck supple.  Cardiovascular: Normal rate, regular rhythm and normal heart sounds.   Pulmonary/Chest: Effort normal and breath sounds normal. Right breast exhibits no inverted nipple, no mass, no nipple discharge, no skin change and no tenderness. Left breast exhibits no inverted nipple, no mass, no nipple discharge, no skin change and no tenderness.    Well healed scar  left breast just outside areola margin  Lymphadenopathy:    She has no cervical adenopathy.    She has no axillary adenopathy.  Neurological: She is alert and oriented to person, place, and time.  Skin: Skin is warm and dry.  Psychiatric: Her behavior is normal.    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  - DUCT ECTASIA.  - FIBROCYSTIC CHANGES INCLUDING CYST FORMATION, APOCRINE  METAPLASIA, USUAL DUCTAL HYPERPLASIA, AND COLUMNAR CELL CHANGE.  Marland Kitchen  Comment  The margins are negative for ADH and FEA. ADH is 0.1 cm from  closest [facility] inked margin.  03/12/2017 MR directed breast biopsy at 9:00:  Breast, left, needle core biopsy, 8:30 o'clock - FIBROCYSTIC CHANGES WITH CALCIFICATIONS. - PSEUDOANGIOMATOUS STROMAL HYPERPLASIA ([place]). - THERE IS NO EVIDENCE OF MALIGNANCY.  Screening mammograms of 02/23/2017 as well as diagnostic mammograms of 03/05/2017 postbiopsy mammograms of 03/12/2017 were reviewed. There is been a focal density in the retroareolar area dated back several years that has undergone minimal change.  Postbiopsy images show the clip in the 9:00 position about 47 m from the nipple.  Ultrasound examination was completed today due to the patient's report of recurrent nipple drainage and the density noted on serial mammograms. In the retroareolar area at the 1:00 position 1 cm from the nipple there is a hypoechoic area measuring up to 0.67 cm without increased vascularity and without direct continuity with any ducts. No significant ductal dilatation. BI-RADS-4.  At the 9:00 position 5 synovator some nipple there is a hyperechoic area with posterior shadowing which may likely represent the previously placed biopsy clip. The said defect measures 0.347 m in maximal diameter. BI-RADS-2.  Assessment    Recurrent nipple discharge  status post left retromalleolar eyedrops he.  Recent MR directed biopsy with diagnosis unrelated to present clinical issues showing evidence of pseudo-angiomatous stromal hyperplasia. .    Plan    Recurrent nipple drainage for which exploration and excision of the retroareolar ductal structures reasonable.  The density noted on mammograms and ultrasounds will be resected at the same setting. This may be scar from her 2014 biopsy.  The option to formally excise the area of pseudo-angiomatous stromal hyperplasia at the same setting which would be best done with needle localization to confirm that the ultrasound area correlates with the mammographic imaging could be completed at the same time if she wants to wipe this late clean for any pathology in the breast. Pros and cons of all options reviewed.     The patient has a first-degree relative with colon cancer (last colonoscopy  2013) see completed by Verdie Shire, M.D. who is no longer in the area.  Second degree relative with breast cancer (maternal aunt) and paternal grandmother. She may qualify for genetic testing.    She wishes to have both areas removed Investigate BRCA testing  HPI, Physical Exam, Assessment and Plan have been scribed under the direction and in the presence of Robert Bellow, MD. Karie Fetch, RN  I have completed the exam and reviewed the above documentation for accuracy and completeness.  I agree with the above.  Haematologist has been used and any errors in dictation or transcription are unintentional.  Hervey Ard, M.D., F.A.C.S.   Robert Bellow 08/11/2017, 8:37 PM  Patient's surgery has been scheduled for 09-03-17 at [facility]   Dominga Ferry, CMA

## 2017-08-11 NOTE — Patient Instructions (Signed)
The patient is aware to call back for any questions or concerns.  

## 2017-08-12 ENCOUNTER — Other Ambulatory Visit: Payer: Self-pay | Admitting: General Surgery

## 2017-08-12 ENCOUNTER — Telehealth: Payer: Self-pay | Admitting: *Deleted

## 2017-08-12 DIAGNOSIS — M170 Bilateral primary osteoarthritis of knee: Secondary | ICD-10-CM | POA: Diagnosis not present

## 2017-08-12 DIAGNOSIS — N6452 Nipple discharge: Secondary | ICD-10-CM

## 2017-08-12 DIAGNOSIS — N6489 Other specified disorders of breast: Secondary | ICD-10-CM

## 2017-08-12 NOTE — Telephone Encounter (Signed)
Message left on cell phone for patient to call the office.   We need to review surgery date, arrival time, and instructions.

## 2017-08-12 NOTE — Telephone Encounter (Signed)
Patient called the office back to report that she needs to cancel surgery with Dr. Bary Castilla that was scheduled for 09-03-17 at [facility].   The patient states that she needs to have knee surgery and they had a cancellation for 09-06-17.   Patient declines having breast surgery prior to knee surgery. She does not wish to reschedule at this time and will call the office back when she would like to proceed.   Samantha in mammography has been notified of cancellation as well as the O. R.

## 2017-08-20 DIAGNOSIS — Z800 Family history of malignant neoplasm of digestive organs: Secondary | ICD-10-CM | POA: Insufficient documentation

## 2017-08-20 DIAGNOSIS — M76899 Other specified enthesopathies of unspecified lower limb, excluding foot: Secondary | ICD-10-CM | POA: Diagnosis not present

## 2017-08-20 DIAGNOSIS — Z01818 Encounter for other preprocedural examination: Secondary | ICD-10-CM | POA: Diagnosis not present

## 2017-08-25 NOTE — Pre-Procedure Instructions (Signed)
Melissa Hunt [facility]  08/25/2017      [id number] [address], [place] [address] Phone: [phone] Fax: [place], [place] [facility] [address] [place] [address] [place] [address] Phone: [phone] Fax: [phone]    Your procedure is scheduled on October 29  Report to [place] at [email or web address].M.  Call this number if you have problems the morning of surgery:  [phone]   Remember:  Do not eat food or drink liquids after midnight.  Continue all other medications as directed by your physician except follow these medication instructions before surgery   Take these medicines the morning of surgery with A SIP OF WATER  cyclobenzaprine (FLEXERIL) FLUoxetine (PROZAC)  levothyroxine (SYNTHROID, LEVOTHROID) traMADol (ULTRAM)   7 days prior to surgery STOP taking any Aspirin (unless otherwise instructed by your surgeon), Aleve, Naproxen, Ibuprofen, Motrin, Advil, Goody's, BC's, all herbal medications, fish oil, and all vitamins    Do not wear jewelry, make-up or nail polish.  Do not wear lotions, powders, or perfumes, or deoderant.  Do not shave 48 hours prior to surgery.  Do not bring valuables to the hospital.  [facility] is not responsible for any belongings or valuables.  Contacts, dentures or bridgework may not be worn into surgery.  Leave your suitcase in the car.  After surgery it may be brought to your room.  For patients admitted to the hospital, discharge time will be determined by your treatment team.  Patients discharged the day of surgery will not be allowed to drive home.   Special instructions:   [place]- Preparing For Surgery  Before surgery, you can play an important role. Because skin is not sterile, your skin needs to be as free of germs as possible. You can reduce the number of germs on your skin by washing with CHG  (chlorahexidine gluconate) Soap before surgery.  CHG is an antiseptic cleaner which kills germs and bonds with the skin to continue killing germs even after washing.  Please do not use if you have an allergy to CHG or antibacterial soaps. If your skin becomes reddened/irritated stop using the CHG.  Do not shave (including legs and underarms) for at least 48 hours prior to first CHG shower. It is OK to shave your face.  Please follow these instructions carefully.   1. Shower the NIGHT BEFORE SURGERY and the MORNING OF SURGERY with CHG.   2. If you chose to wash your hair, wash your hair first as usual with your normal shampoo.  3. After you shampoo, rinse your hair and body thoroughly to remove the shampoo.  4. Use CHG as you would any other liquid soap. You can apply CHG directly to the skin and wash gently with a scrungie or a clean washcloth.   5. Apply the CHG Soap to your body ONLY FROM THE NECK DOWN.  Do not use on open wounds or open sores. Avoid contact with your eyes, ears, mouth and genitals (private parts). Wash Face and genitals (private parts)  with your normal soap.  6. Wash thoroughly, paying special attention to the area where your surgery will be performed.  7. Thoroughly rinse your body with warm water from the neck down.  8. DO NOT shower/wash with your normal soap after using and rinsing off the CHG Soap.  9. Pat yourself dry with a CLEAN TOWEL.  10. Wear CLEAN PAJAMAS to bed the night before surgery, wear comfortable clothes the morning of surgery  11. Place CLEAN SHEETS on your bed the night of your first shower and DO NOT SLEEP WITH PETS.    Day of Surgery: Do not apply any deodorants/lotions. Please wear clean clothes to the hospital/surgery center.      Please read over the following fact sheets that you were given.

## 2017-08-26 ENCOUNTER — Encounter (HOSPITAL_COMMUNITY)
Admission: RE | Admit: 2017-08-26 | Discharge: 2017-08-26 | Disposition: A | Discharge status: Home / Self Care | Payer: 59 | Source: Ambulatory Visit | Attending: Orthopedic Surgery | Admitting: Orthopedic Surgery

## 2017-08-26 ENCOUNTER — Other Ambulatory Visit: Payer: 59

## 2017-08-26 ENCOUNTER — Encounter (HOSPITAL_COMMUNITY): Payer: Self-pay

## 2017-08-26 DIAGNOSIS — Z01818 Encounter for other preprocedural examination: Secondary | ICD-10-CM | POA: Insufficient documentation

## 2017-08-26 HISTORY — DX: Myoneural disorder, unspecified: G70.9

## 2017-08-26 HISTORY — DX: Hypothyroidism, unspecified: E03.9

## 2017-08-26 HISTORY — DX: Anxiety disorder, unspecified: F41.9

## 2017-08-26 HISTORY — DX: Fibromyalgia: M79.7

## 2017-08-26 LAB — COMPREHENSIVE METABOLIC PANEL
ALT: 17 U/L (ref 14–54)
AST: 18 U/L (ref 15–41)
Albumin: 4.1 g/dL (ref 3.5–5.0)
Alkaline Phosphatase: 62 U/L (ref 38–126)
Anion gap: 8 (ref 5–15)
BUN: 17 mg/dL (ref 6–20)
CHLORIDE: 106 mmol/L (ref 101–111)
CO2: 24 mmol/L (ref 22–32)
CREATININE: 0.59 mg/dL (ref 0.44–1.00)
Calcium: 9.1 mg/dL (ref 8.9–10.3)
GFR calc Af Amer: >60 mL/min (ref >60)
GFR calc non Af Amer: >60 mL/min (ref >60)
GLUCOSE: 90 mg/dL (ref 65–99)
POTASSIUM: 3.9 mmol/L (ref 3.5–5.1)
SODIUM: 138 mmol/L (ref 135–145)
Total Bilirubin: 0.4 mg/dL (ref 0.3–1.2)
Total Protein: 6.7 g/dL (ref 6.5–8.1)

## 2017-08-26 LAB — PROTIME-INR
INR: 0.93
Prothrombin Time: 12.3 seconds (ref 11.4–15.2)

## 2017-08-26 LAB — CBC WITH DIFFERENTIAL/PLATELET
BASOS ABS: 0 10*3/uL (ref 0.0–0.1)
Basophils Relative: 0 %
EOS ABS: 0.2 10*3/uL (ref 0.0–0.7)
EOS PCT: 2 %
HCT: 42.5 % (ref 36.0–46.0)
Hemoglobin: 14.2 g/dL (ref 12.0–15.0)
Lymphocytes Relative: 30 %
Lymphs Abs: 2.1 10*3/uL (ref 0.7–4.0)
MCH: 31.1 pg (ref 26.0–34.0)
MCHC: 33.4 g/dL (ref 30.0–36.0)
MCV: 93.2 fL (ref 78.0–100.0)
Monocytes Absolute: 0.8 10*3/uL (ref 0.1–1.0)
Monocytes Relative: 12 %
Neutro Abs: 3.8 10*3/uL (ref 1.7–7.7)
Neutrophils Relative %: 56 %
PLATELETS: 225 10*3/uL (ref 150–400)
RBC: 4.56 MIL/uL (ref 3.87–5.11)
RDW: 13 % (ref 11.5–15.5)
WBC: 7 10*3/uL (ref 4.0–10.5)

## 2017-08-26 LAB — SURGICAL PCR SCREEN
MRSA, PCR: NEGATIVE
STAPHYLOCOCCUS AUREUS: NEGATIVE

## 2017-08-26 LAB — ABO/RH: ABO/RH(D): A NEG

## 2017-08-26 LAB — APTT: APTT: 29 s (ref 24–36)

## 2017-08-26 LAB — TYPE AND SCREEN
ABO/RH(D): A NEG
ANTIBODY SCREEN: NEGATIVE

## 2017-08-26 NOTE — Progress Notes (Addendum)
PCP is Dr. Emily Filbert States she saw a heart Dr back in 2011 since she was having chest pain, it turned out to be esophageal spasms.  Stress test noted under imaging in epic.  Denies ever having a card cath.  Copy of Ekg placed on her chart from Dr Sanjuan Dame office  Denies any chest pain, fever, or cough.

## 2017-08-27 LAB — URINE CULTURE

## 2017-08-30 NOTE — Progress Notes (Signed)
Message left with Sherri at Dr Penelope Coop office to inform of urine culture. Asked if we need to re collect day of surgery. To return call,

## 2017-08-31 DIAGNOSIS — M170 Bilateral primary osteoarthritis of knee: Secondary | ICD-10-CM | POA: Diagnosis not present

## 2017-08-31 NOTE — H&P (Signed)
TOTAL KNEE ADMISSION H&P  Patient is being admitted for left total knee arthroplasty.  Subjective:  Chief Complaint:left knee pain.  HPI: Melissa Hunt, 61 y.o. female, has a history of pain and functional disability in the left knee due to arthritis and has failed non-surgical conservative treatments for greater than 12 weeks to includeNSAID's and/or analgesics, corticosteriod injections, viscosupplementation injections, flexibility and strengthening excercises and supervised PT with diminished ADL's post treatment.  Onset of symptoms was gradual, starting 10 years ago with gradually worsening course since that time. The patient noted prior procedures on the knee to include  arthroscopy and menisectomy on the left knee(s).  Patient currently rates pain in the left knee(s) at 10 out of 10 with activity. Patient has night pain, worsening of pain with activity and weight bearing and pain that interferes with activities of daily living.  Patient has evidence of subchondral sclerosis, periarticular osteophytes and joint space narrowing by imaging studies. . There is no active infection.  Patient Active Problem List   Diagnosis Date Noted  . Nipple discharge 08/11/2017  . Pseudoangiomatous stromal hyperplasia of breast 08/11/2017  . Atypical ductal hyperplasia of breast 01/11/2014   Past Medical History:  Diagnosis Date  . Anxiety   . Arthritis 2008  . Breast discharge 2 weeks ago   LEFT BREAST BLOODY  . Fibromyalgia   . Hypertension 2000  . Hypothyroidism   . Lump or mass in breast   . Neoplasm of uncertain behavior of breast   . 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 BREAST BIOPSY Left 2014   neg, but took Tamoxifen 1 1/2 yrs  . BREAST BIOPSY Right    many years ago  . BREAST BIOPSY Left 03/12/2017   FIBROCYSTIC CHANGES WITH CALCIFICATIONS.[place]  . BREAST SURGERY Left 2014   breast biopsy/Atypical  ductal hyperplasia of breast   . BREAST SURGERY Right 1999   excision of mass  . CESAREAN SECTION  1985  . CHOLECYSTECTOMY  1994  . COLONOSCOPY  2013   Dr Candace Cruise  . ENDOMETRIAL ABLATION  2006    No current facility-administered medications for this encounter.    Current Outpatient Prescriptions  Medication Sig Dispense Refill Last Dose  . Cholecalciferol (VITAMIN D3) 2000 UNITS TABS Take 1 capsule by mouth daily.   Taking  . clobetasol cream (TEMOVATE) 1.44 % Apply 1 application topically 2 (two) times daily as needed.     . cyclobenzaprine (FLEXERIL) 10 MG tablet Take 10 mg by mouth 3 (three) times daily as needed for muscle spasms.     Marland Kitchen FLUoxetine (PROZAC) 40 MG capsule Take 40 mg by mouth daily.    Taking  . hydrochlorothiazide (MICROZIDE) 12.5 MG capsule Take 12.5 mg by mouth daily as needed.     Marland Kitchen levothyroxine (SYNTHROID, LEVOTHROID) 150 MCG tablet Take 150 mcg by mouth daily before breakfast.    Taking  . MAGNESIUM CITRATE PO Take 1 capsule by mouth daily.     . Multiple Vitamins-Minerals (MULTIVITAMIN WITH MINERALS) tablet Take 1 tablet by mouth daily.     Marland Kitchen olmesartan (BENICAR) 20 MG tablet Take 10 mg by mouth daily.    Taking  . tiZANidine (ZANAFLEX) 4 MG tablet TAKE 1/2 TO 1 TABLET BY MOUTH TWICE A DAY AS NEEDED FOR SPASM (MAY CAUSE DROWSINESS) (MAIL REQ)  1 Taking  . traMADol (ULTRAM) 50 MG tablet Take 50 mg by mouth daily.  Taking   No Known Allergies  Social History  Substance Use Topics  . Smoking status: Never Smoker  . Smokeless tobacco: Never Used  . Alcohol use 0.6 oz/week    1 Glasses of wine per week    Family History  Problem Relation Age of Onset  . Cancer Father 10       colon  . Cancer Paternal Aunt        liver  . Cancer Maternal Grandmother        lung  . Breast cancer Maternal Aunt   . Breast cancer Paternal Aunt      Review of Systems  Constitutional: Negative.   HENT: Negative.   Eyes: Negative.   Respiratory: Negative.   Cardiovascular:  Negative.   Gastrointestinal: Negative.   Genitourinary: Negative.   Musculoskeletal: Positive for back pain and joint pain.  Skin: Negative.   Neurological: Negative.   Endo/Heme/Allergies: Negative.   Psychiatric/Behavioral: Negative.     Objective:  Physical Exam  Constitutional: She is oriented to person, place, and time. She appears well-developed and well-nourished.  HENT:  Head: Normocephalic and atraumatic.  Mouth/Throat: Oropharynx is clear and moist.  Eyes: Pupils are equal, round, and reactive to light. Conjunctivae are normal.  Neck: Neck supple.  Cardiovascular: Normal rate.   Respiratory: Effort normal.  GI: Soft.  Musculoskeletal:  Examination of her left knee reveals pain medially and laterally.  1+ crepitation.  1+ synovitis.  Range of motion 0-110 degrees.  Knee is stable with normal patella tracking.  Examination of the right knee reveals full range of motion without pain, swelling, weakness or instability.  Vascular exam: Pulses are 2+ and symmetric.  Neurologic exam: Distal motor and sensory examination is within normal limits.    Neurological: She is alert and oriented to person, place, and time.  Skin: Skin is warm and dry.  Psychiatric: She has a normal mood and affect. Her behavior is normal.    Vital signs in last 24 hours:    Labs:   Estimated body mass index is 37.61 kg/m as calculated from the following:   Height as of 08/26/17: 5\' 3"  (1.6 m).   Weight as of 08/26/17: 96.3 kg (212 lb 4.8 oz).   Imaging Review Plain radiographs demonstrate severe degenerative joint disease of the left knee(s). The overall alignment issignificant varus. The bone quality appears to be excellent for age and reported activity level.  Assessment/Plan:  End stage arthritis, left knee   The patient history, physical examination, clinical judgment of the provider and imaging studies are consistent with end stage degenerative joint disease of the left knee(s) and  total knee arthroplasty is deemed medically necessary. The treatment options including medical management, injection therapy arthroscopy and arthroplasty were discussed at length. The risks and benefits of total knee arthroplasty were presented and reviewed. The risks due to aseptic loosening, infection, stiffness, patella tracking problems, thromboembolic complications and other imponderables were discussed. The patient acknowledged the explanation, agreed to proceed with the plan and consent was signed. Patient is being admitted for inpatient treatment for surgery, pain control, PT, OT, prophylactic antibiotics, VTE prophylaxis, progressive ambulation and ADL's and discharge planning. The patient is planning to be discharged home with home health services Will use Eliquis post op for DVT prophylaxis.

## 2017-09-03 ENCOUNTER — Ambulatory Visit: Payer: 59

## 2017-09-03 ENCOUNTER — Ambulatory Visit: Admit: 2017-09-03 | Payer: 59 | Admitting: General Surgery

## 2017-09-03 SURGERY — BREAST LUMPECTOMY WITH NEEDLE LOCALIZATION
Anesthesia: Choice | Laterality: Left

## 2017-09-03 MED ORDER — LACTATED RINGERS IV SOLN: INTRAVENOUS | Status: DC

## 2017-09-03 MED ORDER — CEFAZOLIN SODIUM-DEXTROSE 2-4 GM/100ML-% IV SOLN
2.0000 g | INTRAVENOUS | Status: AC
  Administered 2017-09-06: 2 g via INTRAVENOUS
  Filled 2017-09-03: qty 100

## 2017-09-03 MED ORDER — TRANEXAMIC ACID 1000 MG/10ML IV SOLN
1000.0000 mg | INTRAVENOUS | Status: DC
  Filled 2017-09-03: qty 10

## 2017-09-05 NOTE — Anesthesia Preprocedure Evaluation (Addendum)
Anesthesia Evaluation  Patient identified by MRN, date of birth, ID band Patient awake    Reviewed: Allergy & Precautions, H&P , Patient's Chart, lab work & pertinent test results, reviewed documented beta blocker date and time   Airway Mallampati: II  TM Distance: >3 FB Neck ROM: full    Dental no notable dental hx.    Pulmonary    Pulmonary exam normal breath sounds clear to auscultation       Cardiovascular hypertension,  Rhythm:regular Rate:Normal     Neuro/Psych    GI/Hepatic   Endo/Other    Renal/GU      Musculoskeletal   Abdominal   Peds  Hematology   Anesthesia Other Findings   Reproductive/Obstetrics                             Anesthesia Physical Anesthesia Plan  ASA: II  Anesthesia Plan: Spinal   Post-op Pain Management:    Induction:   PONV Risk Score and Plan: 2 and Ondansetron, Dexamethasone and Treatment may vary due to age or medical condition  Airway Management Planned:   Additional Equipment:   Intra-op Plan:   Post-operative Plan:   Informed Consent: I have reviewed the patients History and Physical, chart, labs and discussed the procedure including the risks, benefits and alternatives for the proposed anesthesia with the patient or authorized representative who has indicated his/her understanding and acceptance.   Dental Advisory Given  Plan Discussed with: CRNA and Surgeon  Anesthesia Plan Comments: (  )       Anesthesia Quick Evaluation

## 2017-09-06 ENCOUNTER — Inpatient Hospital Stay (HOSPITAL_COMMUNITY): Payer: 59 | Admitting: Anesthesiology

## 2017-09-06 ENCOUNTER — Encounter (HOSPITAL_COMMUNITY)
Admission: RE | Disposition: A | Discharge status: Home Health | Payer: Self-pay | Source: Ambulatory Visit | Attending: Orthopedic Surgery

## 2017-09-06 ENCOUNTER — Inpatient Hospital Stay (HOSPITAL_COMMUNITY)
Admission: RE | Admit: 2017-09-06 | Discharge: 2017-09-08 | DRG: 470 | Disposition: A | Discharge status: Home Health | Payer: 59 | Source: Ambulatory Visit | Attending: Orthopedic Surgery | Admitting: Orthopedic Surgery

## 2017-09-06 ENCOUNTER — Encounter (HOSPITAL_COMMUNITY): Payer: Self-pay | Admitting: *Deleted

## 2017-09-06 DIAGNOSIS — M1712 Unilateral primary osteoarthritis, left knee: Principal | ICD-10-CM | POA: Diagnosis present

## 2017-09-06 DIAGNOSIS — Z79891 Long term (current) use of opiate analgesic: Secondary | ICD-10-CM

## 2017-09-06 DIAGNOSIS — M797 Fibromyalgia: Secondary | ICD-10-CM | POA: Diagnosis present

## 2017-09-06 DIAGNOSIS — Z79899 Other long term (current) drug therapy: Secondary | ICD-10-CM | POA: Diagnosis not present

## 2017-09-06 DIAGNOSIS — E039 Hypothyroidism, unspecified: Secondary | ICD-10-CM | POA: Diagnosis not present

## 2017-09-06 DIAGNOSIS — Z96652 Presence of left artificial knee joint: Secondary | ICD-10-CM | POA: Diagnosis not present

## 2017-09-06 DIAGNOSIS — I10 Essential (primary) hypertension: Secondary | ICD-10-CM | POA: Diagnosis not present

## 2017-09-06 DIAGNOSIS — G8918 Other acute postprocedural pain: Secondary | ICD-10-CM | POA: Diagnosis not present

## 2017-09-06 DIAGNOSIS — N6452 Nipple discharge: Secondary | ICD-10-CM | POA: Diagnosis not present

## 2017-09-06 HISTORY — DX: Unilateral primary osteoarthritis, left knee: M17.12

## 2017-09-06 HISTORY — PX: TOTAL KNEE ARTHROPLASTY: SHX125

## 2017-09-06 SURGERY — ARTHROPLASTY, KNEE, TOTAL
Anesthesia: Regional | Laterality: Left

## 2017-09-06 MED ORDER — HYDROMORPHONE HCL 1 MG/ML IJ SOLN
1.0000 mg | INTRAMUSCULAR | Status: DC | PRN
  Administered 2017-09-06 (×2): 1 mg via INTRAVENOUS
  Filled 2017-09-06 (×2): qty 1

## 2017-09-06 MED ORDER — PHENYLEPHRINE HCL 10 MG/ML IJ SOLN
INTRAVENOUS | Status: DC | PRN
  Administered 2017-09-06: 40 ug/min via INTRAVENOUS

## 2017-09-06 MED ORDER — DEXAMETHASONE SODIUM PHOSPHATE 10 MG/ML IJ SOLN
INTRAMUSCULAR | Status: AC
  Filled 2017-09-06: qty 1

## 2017-09-06 MED ORDER — POTASSIUM CHLORIDE IN NACL 20-0.9 MEQ/L-% IV SOLN
INTRAVENOUS | Status: DC
  Administered 2017-09-06 – 2017-09-07 (×2): via INTRAVENOUS
  Filled 2017-09-06 (×2): qty 1000

## 2017-09-06 MED ORDER — BUPIVACAINE-EPINEPHRINE (PF) 0.5% -1:200000 IJ SOLN
INTRAMUSCULAR | Status: DC | PRN
  Administered 2017-09-06: 20 mL via PERINEURAL

## 2017-09-06 MED ORDER — DOCUSATE SODIUM 100 MG PO CAPS
100.0000 mg | ORAL_CAPSULE | Freq: Two times a day (BID) | ORAL | Status: DC
  Administered 2017-09-06 – 2017-09-07 (×4): 100 mg via ORAL
  Filled 2017-09-06 (×5): qty 1

## 2017-09-06 MED ORDER — DEXAMETHASONE SODIUM PHOSPHATE 4 MG/ML IJ SOLN
INTRAMUSCULAR | Status: DC | PRN
  Administered 2017-09-06: 10 mg via INTRAVENOUS

## 2017-09-06 MED ORDER — EPHEDRINE SULFATE-NACL 50-0.9 MG/10ML-% IV SOSY
PREFILLED_SYRINGE | INTRAVENOUS | Status: DC | PRN
  Administered 2017-09-06 (×2): 5 mg via INTRAVENOUS

## 2017-09-06 MED ORDER — ONDANSETRON HCL 4 MG/2ML IJ SOLN: 4.0000 mg | Freq: Four times a day (QID) | INTRAMUSCULAR | Status: DC | PRN

## 2017-09-06 MED ORDER — BUPIVACAINE-EPINEPHRINE (PF) 0.25% -1:200000 IJ SOLN
INTRAMUSCULAR | Status: AC
  Filled 2017-09-06: qty 30

## 2017-09-06 MED ORDER — PHENYLEPHRINE 40 MCG/ML (10ML) SYRINGE FOR IV PUSH (FOR BLOOD PRESSURE SUPPORT)
PREFILLED_SYRINGE | INTRAVENOUS | Status: DC | PRN
  Administered 2017-09-06: 40 ug via INTRAVENOUS
  Administered 2017-09-06 (×2): 80 ug via INTRAVENOUS

## 2017-09-06 MED ORDER — FLUOXETINE HCL 20 MG PO CAPS
40.0000 mg | ORAL_CAPSULE | Freq: Every day | ORAL | Status: DC
  Administered 2017-09-07 – 2017-09-08 (×2): 40 mg via ORAL
  Filled 2017-09-06 (×3): qty 2

## 2017-09-06 MED ORDER — ADULT MULTIVITAMIN W/MINERALS CH
1.0000 | ORAL_TABLET | Freq: Every day | ORAL | Status: DC
  Administered 2017-09-06 – 2017-09-08 (×3): 1 via ORAL
  Filled 2017-09-06 (×4): qty 1

## 2017-09-06 MED ORDER — BUPIVACAINE-EPINEPHRINE 0.25% -1:200000 IJ SOLN
INTRAMUSCULAR | Status: DC | PRN
  Administered 2017-09-06: 20 mL

## 2017-09-06 MED ORDER — LACTATED RINGERS IV SOLN
INTRAVENOUS | Status: DC | PRN
  Administered 2017-09-06 (×2): via INTRAVENOUS

## 2017-09-06 MED ORDER — PHENOL 1.4 % MT LIQD: 1.0000 | OROMUCOSAL | Status: DC | PRN

## 2017-09-06 MED ORDER — ONDANSETRON HCL 4 MG PO TABS: 4.0000 mg | ORAL_TABLET | Freq: Four times a day (QID) | ORAL | Status: DC | PRN

## 2017-09-06 MED ORDER — DIPHENHYDRAMINE HCL 12.5 MG/5ML PO ELIX
12.5000 mg | ORAL_SOLUTION | ORAL | Status: DC | PRN
  Administered 2017-09-07: 25 mg via ORAL
  Filled 2017-09-06: qty 10

## 2017-09-06 MED ORDER — FENTANYL CITRATE (PF) 100 MCG/2ML IJ SOLN
INTRAMUSCULAR | Status: DC | PRN
  Administered 2017-09-06: 50 ug via INTRAVENOUS

## 2017-09-06 MED ORDER — CHLORHEXIDINE GLUCONATE 4 % EX LIQD: 60.0000 mL | Freq: Once | CUTANEOUS | Status: DC

## 2017-09-06 MED ORDER — POVIDONE-IODINE 7.5 % EX SOLN
Freq: Once | CUTANEOUS | Status: DC
  Filled 2017-09-06: qty 118

## 2017-09-06 MED ORDER — TIZANIDINE HCL 4 MG PO TABS
4.0000 mg | ORAL_TABLET | Freq: Four times a day (QID) | ORAL | Status: DC | PRN
  Administered 2017-09-06 – 2017-09-08 (×7): 4 mg via ORAL
  Filled 2017-09-06 (×7): qty 1

## 2017-09-06 MED ORDER — METOCLOPRAMIDE HCL 5 MG PO TABS: 5.0000 mg | ORAL_TABLET | Freq: Three times a day (TID) | ORAL | Status: DC | PRN

## 2017-09-06 MED ORDER — FENTANYL CITRATE (PF) 250 MCG/5ML IJ SOLN
INTRAMUSCULAR | Status: AC
  Filled 2017-09-06: qty 5

## 2017-09-06 MED ORDER — CEFAZOLIN SODIUM-DEXTROSE 2-4 GM/100ML-% IV SOLN
2.0000 g | Freq: Four times a day (QID) | INTRAVENOUS | Status: AC
  Administered 2017-09-06 (×2): 2 g via INTRAVENOUS
  Filled 2017-09-06 (×2): qty 100

## 2017-09-06 MED ORDER — PHENYLEPHRINE 40 MCG/ML (10ML) SYRINGE FOR IV PUSH (FOR BLOOD PRESSURE SUPPORT)
PREFILLED_SYRINGE | INTRAVENOUS | Status: AC
  Filled 2017-09-06: qty 10

## 2017-09-06 MED ORDER — ALUM & MAG HYDROXIDE-SIMETH 200-200-20 MG/5ML PO SUSP: 30.0000 mL | ORAL | Status: DC | PRN

## 2017-09-06 MED ORDER — TRANEXAMIC ACID 1000 MG/10ML IV SOLN
INTRAVENOUS | Status: DC | PRN
  Administered 2017-09-06: 1000 mg via INTRAVENOUS

## 2017-09-06 MED ORDER — MIDAZOLAM HCL 2 MG/2ML IJ SOLN
INTRAMUSCULAR | Status: AC
  Filled 2017-09-06: qty 2

## 2017-09-06 MED ORDER — POLYETHYLENE GLYCOL 3350 17 G PO PACK
17.0000 g | PACK | Freq: Two times a day (BID) | ORAL | Status: DC
  Administered 2017-09-06 – 2017-09-07 (×3): 17 g via ORAL
  Filled 2017-09-06 (×4): qty 1

## 2017-09-06 MED ORDER — ONDANSETRON HCL 4 MG/2ML IJ SOLN
INTRAMUSCULAR | Status: DC | PRN
  Administered 2017-09-06: 4 mg via INTRAVENOUS

## 2017-09-06 MED ORDER — APIXABAN 2.5 MG PO TABS
2.5000 mg | ORAL_TABLET | Freq: Two times a day (BID) | ORAL | Status: DC
  Administered 2017-09-07 – 2017-09-08 (×3): 2.5 mg via ORAL
  Filled 2017-09-06 (×3): qty 1

## 2017-09-06 MED ORDER — LIDOCAINE 2% (20 MG/ML) 5 ML SYRINGE
INTRAMUSCULAR | Status: DC | PRN
  Administered 2017-09-06: 40 mg via INTRAVENOUS

## 2017-09-06 MED ORDER — ACETAMINOPHEN 650 MG RE SUPP: 650.0000 mg | RECTAL | Status: DC | PRN

## 2017-09-06 MED ORDER — OXYCODONE HCL 5 MG PO TABS
5.0000 mg | ORAL_TABLET | ORAL | Status: DC | PRN
  Administered 2017-09-06 – 2017-09-08 (×13): 10 mg via ORAL
  Filled 2017-09-06 (×13): qty 2

## 2017-09-06 MED ORDER — ACETAMINOPHEN 325 MG PO TABS
650.0000 mg | ORAL_TABLET | ORAL | Status: DC | PRN
  Administered 2017-09-07: 650 mg via ORAL
  Filled 2017-09-06: qty 2

## 2017-09-06 MED ORDER — PROPOFOL 500 MG/50ML IV EMUL
INTRAVENOUS | Status: DC | PRN
  Administered 2017-09-06: 50 ug/kg/min via INTRAVENOUS

## 2017-09-06 MED ORDER — LEVOTHYROXINE SODIUM 75 MCG PO TABS
150.0000 ug | ORAL_TABLET | Freq: Every day | ORAL | Status: DC
  Administered 2017-09-07 – 2017-09-08 (×2): 150 ug via ORAL
  Filled 2017-09-06 (×3): qty 2

## 2017-09-06 MED ORDER — MENTHOL 3 MG MT LOZG: 1.0000 | LOZENGE | OROMUCOSAL | Status: DC | PRN

## 2017-09-06 MED ORDER — ACETAMINOPHEN 500 MG PO TABS
1000.0000 mg | ORAL_TABLET | Freq: Four times a day (QID) | ORAL | Status: AC
  Administered 2017-09-06 – 2017-09-07 (×4): 1000 mg via ORAL
  Filled 2017-09-06 (×4): qty 2

## 2017-09-06 MED ORDER — LIDOCAINE 2% (20 MG/ML) 5 ML SYRINGE
INTRAMUSCULAR | Status: AC
  Filled 2017-09-06: qty 5

## 2017-09-06 MED ORDER — FENTANYL CITRATE (PF) 100 MCG/2ML IJ SOLN: 25.0000 ug | INTRAMUSCULAR | Status: DC | PRN

## 2017-09-06 MED ORDER — ONDANSETRON HCL 4 MG/2ML IJ SOLN
INTRAMUSCULAR | Status: AC
  Filled 2017-09-06: qty 2

## 2017-09-06 MED ORDER — SODIUM CHLORIDE 0.9 % IR SOLN
Status: DC | PRN
  Administered 2017-09-06: 3000 mL

## 2017-09-06 MED ORDER — IRBESARTAN 150 MG PO TABS
150.0000 mg | ORAL_TABLET | Freq: Every day | ORAL | Status: DC
  Administered 2017-09-08: 150 mg via ORAL
  Filled 2017-09-06: qty 1

## 2017-09-06 MED ORDER — DEXAMETHASONE SODIUM PHOSPHATE 10 MG/ML IJ SOLN
10.0000 mg | Freq: Three times a day (TID) | INTRAMUSCULAR | Status: AC
  Administered 2017-09-06 – 2017-09-07 (×4): 10 mg via INTRAVENOUS
  Filled 2017-09-06 (×4): qty 1

## 2017-09-06 MED ORDER — MAGNESIUM OXIDE 400 (241.3 MG) MG PO TABS
400.0000 mg | ORAL_TABLET | Freq: Every day | ORAL | Status: DC
  Administered 2017-09-06 – 2017-09-08 (×3): 400 mg via ORAL
  Filled 2017-09-06 (×3): qty 1

## 2017-09-06 MED ORDER — PROPOFOL 10 MG/ML IV BOLUS
INTRAVENOUS | Status: DC | PRN
  Administered 2017-09-06 (×3): 20 mg via INTRAVENOUS

## 2017-09-06 MED ORDER — EPHEDRINE 5 MG/ML INJ
INTRAVENOUS | Status: AC
  Filled 2017-09-06: qty 10

## 2017-09-06 MED ORDER — VITAMIN D 1000 UNITS PO TABS
2000.0000 [IU] | ORAL_TABLET | Freq: Every day | ORAL | Status: DC
  Administered 2017-09-06 – 2017-09-08 (×3): 2000 [IU] via ORAL
  Filled 2017-09-06 (×5): qty 2

## 2017-09-06 MED ORDER — MIDAZOLAM HCL 5 MG/5ML IJ SOLN
INTRAMUSCULAR | Status: DC | PRN
  Administered 2017-09-06 (×2): 1 mg via INTRAVENOUS

## 2017-09-06 MED ORDER — METOCLOPRAMIDE HCL 5 MG/ML IJ SOLN: 5.0000 mg | Freq: Three times a day (TID) | INTRAMUSCULAR | Status: DC | PRN

## 2017-09-06 SURGICAL SUPPLY — 67 items
BANDAGE ACE 6X5 VEL STRL LF (GAUZE/BANDAGES/DRESSINGS) ×2 IMPLANT
BANDAGE ESMARK 6X9 LF (GAUZE/BANDAGES/DRESSINGS) ×1 IMPLANT
BENZOIN TINCTURE PRP APPL 2/3 (GAUZE/BANDAGES/DRESSINGS) ×2 IMPLANT
BLADE SAGITTAL 25.0X1.19X90 (BLADE) ×2 IMPLANT
BLADE SAW SGTL 13X75X1.27 (BLADE) ×2 IMPLANT
BLADE SURG 10 STRL SS (BLADE) ×4 IMPLANT
BNDG ELASTIC 6X15 VLCR STRL LF (GAUZE/BANDAGES/DRESSINGS) ×2 IMPLANT
BNDG ESMARK 6X9 LF (GAUZE/BANDAGES/DRESSINGS) ×2
BOWL SMART MIX CTS (DISPOSABLE) ×2 IMPLANT
CAPT KNEE TOTAL 3 ATTUNE ×2 IMPLANT
CEMENT HV SMART SET (Cement) ×4 IMPLANT
COVER SURGICAL LIGHT HANDLE (MISCELLANEOUS) ×2 IMPLANT
CUFF TOURNIQUET SINGLE 34IN LL (TOURNIQUET CUFF) ×2 IMPLANT
CUFF TOURNIQUET SINGLE 44IN (TOURNIQUET CUFF) IMPLANT
DECANTER SPIKE VIAL GLASS SM (MISCELLANEOUS) ×2 IMPLANT
DRAPE EXTREMITY T 121X128X90 (DRAPE) ×2 IMPLANT
DRAPE HALF SHEET 40X57 (DRAPES) ×4 IMPLANT
DRAPE INCISE IOBAN 66X45 STRL (DRAPES) IMPLANT
DRAPE U-SHAPE 47X51 STRL (DRAPES) ×2 IMPLANT
DRSG AQUACEL AG ADV 3.5X10 (GAUZE/BANDAGES/DRESSINGS) ×2 IMPLANT
DRSG AQUACEL AG ADV 3.5X14 (GAUZE/BANDAGES/DRESSINGS) ×2 IMPLANT
DURAPREP 26ML APPLICATOR (WOUND CARE) ×4 IMPLANT
ELECT CAUTERY BLADE 6.4 (BLADE) ×2 IMPLANT
ELECT REM PT RETURN 9FT ADLT (ELECTROSURGICAL) ×2
ELECTRODE REM PT RTRN 9FT ADLT (ELECTROSURGICAL) ×1 IMPLANT
FACESHIELD WRAPAROUND (MASK) ×4 IMPLANT
GLOVE BIO SURGEON STRL SZ7 (GLOVE) ×4 IMPLANT
GLOVE BIOGEL PI IND STRL 7.0 (GLOVE) ×2 IMPLANT
GLOVE BIOGEL PI IND STRL 7.5 (GLOVE) ×1 IMPLANT
GLOVE BIOGEL PI INDICATOR 7.0 (GLOVE) ×2
GLOVE BIOGEL PI INDICATOR 7.5 (GLOVE) ×1
GLOVE SS BIOGEL STRL SZ 7.5 (GLOVE) ×1 IMPLANT
GLOVE SUPERSENSE BIOGEL SZ 7.5 (GLOVE) ×1
GOWN STRL REUS W/ TWL LRG LVL3 (GOWN DISPOSABLE) ×3 IMPLANT
GOWN STRL REUS W/ TWL XL LVL3 (GOWN DISPOSABLE) ×2 IMPLANT
GOWN STRL REUS W/TWL LRG LVL3 (GOWN DISPOSABLE) ×3
GOWN STRL REUS W/TWL XL LVL3 (GOWN DISPOSABLE) ×2
HANDPIECE INTERPULSE COAX TIP (DISPOSABLE) ×1
HOOD PEEL AWAY FACE SHEILD DIS (HOOD) ×4 IMPLANT
IMMOBILIZER KNEE 22 (SOFTGOODS) ×2 IMPLANT
IMMOBILIZER KNEE 22 UNIV (SOFTGOODS) ×2 IMPLANT
KIT BASIN OR (CUSTOM PROCEDURE TRAY) ×2 IMPLANT
KIT ROOM TURNOVER OR (KITS) ×2 IMPLANT
MANIFOLD NEPTUNE II (INSTRUMENTS) ×2 IMPLANT
MARKER SKIN DUAL TIP RULER LAB (MISCELLANEOUS) ×2 IMPLANT
NEEDLE 18GX1X1/2 (RX/OR ONLY) (NEEDLE) ×2 IMPLANT
NS IRRIG 1000ML POUR BTL (IV SOLUTION) ×2 IMPLANT
PACK TOTAL JOINT (CUSTOM PROCEDURE TRAY) ×2 IMPLANT
PAD ARMBOARD 7.5X6 YLW CONV (MISCELLANEOUS) ×4 IMPLANT
SET HNDPC FAN SPRY TIP SCT (DISPOSABLE) ×1 IMPLANT
STRIP CLOSURE SKIN 1/2X4 (GAUZE/BANDAGES/DRESSINGS) ×2 IMPLANT
SUCTION FRAZIER HANDLE 10FR (MISCELLANEOUS) ×1
SUCTION TUBE FRAZIER 10FR DISP (MISCELLANEOUS) ×1 IMPLANT
SUT MNCRL AB 3-0 PS2 18 (SUTURE) ×2 IMPLANT
SUT VIC AB 0 CT1 27 (SUTURE) ×2
SUT VIC AB 0 CT1 27XBRD ANBCTR (SUTURE) ×2 IMPLANT
SUT VIC AB 1 CT1 27 (SUTURE) ×1
SUT VIC AB 1 CT1 27XBRD ANBCTR (SUTURE) ×1 IMPLANT
SUT VIC AB 2-0 CT1 27 (SUTURE) ×2
SUT VIC AB 2-0 CT1 TAPERPNT 27 (SUTURE) ×2 IMPLANT
SYR 30ML LL (SYRINGE) ×2 IMPLANT
TOWEL OR 17X24 6PK STRL BLUE (TOWEL DISPOSABLE) ×2 IMPLANT
TOWEL OR 17X26 10 PK STRL BLUE (TOWEL DISPOSABLE) ×2 IMPLANT
TRAY CATH 16FR W/PLASTIC CATH (SET/KITS/TRAYS/PACK) IMPLANT
TRAY FOLEY CATH SILVER 16FR (SET/KITS/TRAYS/PACK) ×2 IMPLANT
TUBE CONNECTING 12X1/4 (SUCTIONS) ×2 IMPLANT
YANKAUER SUCT BULB TIP NO VENT (SUCTIONS) ×2 IMPLANT

## 2017-09-06 NOTE — Interval H&P Note (Signed)
History and Physical Interval Note:  09/06/2017 7:05 AM  Melissa Hunt  has presented today for surgery, with the diagnosis of djd left knee  The various methods of treatment have been discussed with the patient and family. After consideration of risks, benefits and other options for treatment, the patient has consented to  Procedure(s): TOTAL KNEE ARTHROPLASTY (Left) as a surgical intervention .  The patient's history has been reviewed, patient examined, no change in status, stable for surgery.  I have reviewed the patient's chart and labs.  Questions were answered to the patient's satisfaction.     Elsie Saas A

## 2017-09-06 NOTE — Anesthesia Procedure Notes (Signed)
Anesthesia Regional Block: Adductor canal block   Pre-Anesthetic Checklist: ,, timeout performed, Correct Patient, Correct Site, Correct Laterality, Correct Procedure, Correct Position, site marked, Risks and benefits discussed, pre-op evaluation,  At surgeon's request and post-op pain management  Laterality: Left  Prep: chloraprep       Needles:   Needle Type: Echogenic Needle     Needle Length: 9cm  Needle Gauge: 21     Additional Needles:   Narrative:  Start time: 09/06/2017 6:58 AM End time: 09/06/2017 7:05 AM Injection made incrementally with aspirations every 5 mL. Anesthesiologist: Lyndle Herrlich

## 2017-09-06 NOTE — Transfer of Care (Signed)
Immediate Anesthesia Transfer of Care Note  Patient: Melissa Hunt  Procedure(s) Performed: TOTAL KNEE ARTHROPLASTY (Left )  Patient Location: PACU  Anesthesia Type:Regional and Spinal  Level of Consciousness: awake, alert , oriented and patient cooperative  Airway & Oxygen Therapy: Patient Spontanous Breathing and Patient connected to nasal cannula oxygen  Post-op Assessment: Report given to RN and Post -op Vital signs reviewed and stable  Post vital signs: Reviewed and stable  Last Vitals:  Vitals:   09/06/17 0555  BP: 122/61  Pulse: 65  Resp: 18  Temp: 36.8 C  SpO2: 97%    Last Pain:  Vitals:   09/06/17 0603  PainSc: 5       Patients Stated Pain Goal: 4 (86/77/37 3668)  Complications: No apparent anesthesia complications

## 2017-09-06 NOTE — Op Note (Signed)
MRN:     [id number] DOB/AGE:    04-14-1956 / 61 y.o.       OPERATIVE REPORT    DATE OF PROCEDURE:  09/06/2017       PREOPERATIVE DIAGNOSIS:   Primary localized OA left knee      Estimated body mass index is 37.55 kg/m as calculated from the following:   Height as of 08/26/17: 5\' 3"  (1.6 m).   Weight as of this encounter: 96.2 kg (212 lb).                                                        POSTOPERATIVE DIAGNOSIS:   same                                                                     PROCEDURE:  Procedure(s): TOTAL KNEE ARTHROPLASTY Using Depuy Attune RP implants #5 Femur, #5Tibia, 83mm  RP bearing, 32 Patella     SURGEON: Analeia Ismael A    ASSISTANT:  Kirstin Shepperson PA-C   (Present and scrubbed throughout the case, critical for assistance with exposure, retraction, instrumentation, and closure.)         ANESTHESIA: Spinal with Adductor Nerve Block     TOURNIQUET TIME: [id number]   COMPLICATIONS:  None     SPECIMENS: None   INDICATIONS FOR PROCEDURE: The patient has  djd left knee, varus deformities, XR shows bone on bone arthritis. Patient has failed all conservative measures including anti-inflammatory medicines, narcotics, attempts at  exercise and weight loss, cortisone injections and viscosupplementation.  Risks and benefits of surgery have been discussed, questions answered.   DESCRIPTION OF PROCEDURE: The patient identified by armband, received  right femoral nerve block and IV antibiotics, in the holding area at [facility]. Patient taken to the operating room, appropriate anesthetic  monitors were attached Spinal anesthesia induced with  the patient in supine position, Foley catheter was inserted. Tourniquet  applied high to the operative thigh. Lateral post and foot positioner  applied to the table, the lower extremity was then prepped and draped  in usual sterile fashion from the ankle to the tourniquet. Time-out procedure was performed. The limb was  wrapped with an Esmarch bandage and the tourniquet inflated to 365 mmHg. We began the operation by making the anterior midline incision starting at handbreadth above the patella going over the patella 1 cm medial to and  4 cm distal to the tibial tubercle. Small bleeders in the skin and the  subcutaneous tissue identified and cauterized. Transverse retinaculum was incised and reflected medially and a medial parapatellar arthrotomy was accomplished. the patella was everted and theprepatellar fat pad resected. The superficial medial collateral  ligament was then elevated from anterior to posterior along the proximal  flare of the tibia and anterior half of the menisci resected. The knee was hyperflexed exposing bone on bone arthritis. Peripheral and notch osteophytes as well as the cruciate ligaments were then resected. We continued to  work our way around posteriorly along the proximal tibia, and externally  rotated the tibia subluxing it out from underneath  the femur. A McHale  retractor was placed through the notch and a lateral Hohmann retractor  placed, and we then drilled through the proximal tibia in line with the  axis of the tibia followed by an intramedullary guide rod and 2-degree  posterior slope cutting guide. The tibial cutting guide was pinned into place  allowing resection of 4 mm of bone medially and about 6 mm of bone  laterally because of her varus deformity. Satisfied with the tibial resection, we then  entered the distal femur 2 mm anterior to the PCL origin with the  intramedullary guide rod and applied the distal femoral cutting guide  set at 56mm, with 5 degrees of valgus. This was pinned along the  epicondylar axis. At this point, the distal femoral cut was accomplished without difficulty. We then sized for a #5 femoral component and pinned the guide in 3 degrees of external rotation.The chamfer cutting guide was pinned into place. The anterior, posterior, and chamfer cuts were  accomplished without difficulty followed by  the  RP box cutting guide and the box cut. We also removed posterior osteophytes from the posterior femoral condyles. At this  time, the knee was brought into full extension. We checked our  extension and flexion gaps and found them symmetric at 36mm.  The patella thickness measured at 25 mm. We set the cutting guide at 15 and removed the posterior 9.5-10 mm  of the patella sized for 32 button and drilled the lollipop. The knee  was then once again hyperflexed exposing the proximal tibia. We sized for a #5 tibial base plate, applied the smokestack and the conical reamer followed by the the Delta fin keel punch. We then hammered into place the  RP trial femoral component, inserted a 1 trial bearing, trial patellar button, and took the knee through range of motion from 0-130 degrees. No thumb pressure was required for patellar  tracking. At this point, all trial components were removed, a double batch of DePuy HV cement  was mixed and applied to all bony metallic mating surfaces except for the posterior condyles of the femur itself. In order, we  hammered into place the tibial tray and removed excess cement, the femoral component and removed excess cement, a 37mm  RP bearing  was inserted, and the knee brought to full extension with compression.  The patellar button was clamped into place, and excess cement  removed. While the cement cured the wound was irrigated out with normal saline solution pulse lavage.. Ligament stability and patellar tracking were checked and found to be excellent.. The parapatellar arthrotomy was closed with  #1 Vicryl suture. The subcutaneous tissue with 0 and 2-0 undyed  Vicryl suture, and 4-0 Monocryl.. A dressing of Aquaseal,  4 x 4, dressing sponges, Webril, and Ace wrap applied. Needle and sponge count were correct times 2.The patient awakened, extubated, and taken to recovery room without difficulty. Vascular status was normal,  pulses 2+ and symmetric.   Domique Clapper A 09/06/2017, 9:03 AM

## 2017-09-06 NOTE — Evaluation (Signed)
Physical Therapy Evaluation Patient Details Name: Melissa Hunt MRN: [id number] DOB: September 11, 1956 Today's Date: 09/06/2017   History of Present Illness  Pt is a 61 y/o female s/p elective L TKA. PMH includes anxiety, HTN, fibromyalgia, and breast mass s/p excision.   Clinical Impression  Pt is s/p surgery above with deficits below. PTA, pt was using cane for ambulation. Upon eval, pt presenting with post op pain and weakness, and decreased balance. Required min to min guard assist for mobility this session. Reports husband will be able to assist at home and will need DME below. Follow up recommendations per MD arrangements. Will continue to follow acutely to maximize functional mobility independence and safety.     Follow Up Recommendations DC plan and follow up therapy as arranged by surgeon;Supervision for mobility/OOB    Equipment Recommendations  3in1 (PT)    Recommendations for Other Services       Precautions / Restrictions Precautions Precautions: Knee Precaution Booklet Issued: Yes (comment) Precaution Comments: Reviewed supine ther ex with pt.  Required Braces or Orthoses: Knee Immobilizer - Left Knee Immobilizer - Left: Other (comment) (until discontinued ) Restrictions Weight Bearing Restrictions: Yes LLE Weight Bearing: Weight bearing as tolerated      Mobility  Bed Mobility Overal bed mobility: Needs Assistance Bed Mobility: Supine to Sit     Supine to sit: Min assist     General bed mobility comments: Min A for LLE management and trunk elevation.   Transfers Overall transfer level: Needs assistance Equipment used: Rolling walker (2 wheeled) Transfers: Sit to/from Stand Sit to Stand: Min assist         General transfer comment: Min A for lift assist and steadying upon standing. Verbal cues for safe hand placment.   Ambulation/Gait Ambulation/Gait assistance: Min guard Ambulation Distance (Feet): 5 Feet Assistive device: Rolling walker (2  wheeled) Gait Pattern/deviations: Step-to pattern;Decreased step length - right;Decreased step length - left;Decreased weight shift to left;Antalgic Gait velocity: Decreased Gait velocity interpretation: Below normal speed for age/gender General Gait Details: Slow, antalgic gait secondary to post op pain and weakness. Required verbal cues for sequencing with RW. Further distance limited secondary to pain.   Stairs            Wheelchair Mobility    Modified Rankin (Stroke Patients Only)       Balance Overall balance assessment: Needs assistance Sitting-balance support: No upper extremity supported;Feet supported Sitting balance-Leahy Scale: Good     Standing balance support: Bilateral upper extremity supported;During functional activity Standing balance-Leahy Scale: Poor Standing balance comment: Reliant on UE support                              Pertinent Vitals/Pain Pain Assessment: 0-10 Pain Score: 9  Pain Location: L knee  Pain Descriptors / Indicators: Aching;Operative site guarding Pain Intervention(s): Limited activity within patient's tolerance;Monitored during session;Repositioned    Home Living Family/patient expects to be discharged to:: Private residence Living Arrangements: Spouse/significant other Available Help at Discharge: Family;Available 24 hours/day Type of Home: House Home Access: Stairs to enter Entrance Stairs-Rails: None (wide enough that walker fits on steps ) Entrance Stairs-Number of Steps: 3 Home Layout: Two level;Able to live on main level with bedroom/bathroom Home Equipment: Gilford Rile - 2 wheels;Other (comment);Shower seat;Cane - single point (CPM )      Prior Function Level of Independence: Independent with assistive device(s)         Comments: Used cane  for amublation      Hand Dominance   Dominant Hand: Right    Extremity/Trunk Assessment   Upper Extremity Assessment Upper Extremity Assessment: Defer to OT  evaluation    Lower Extremity Assessment Lower Extremity Assessment: LLE deficits/detail LLE Deficits / Details: Sensory in tact. Deficits consistent with post op pain and weakness. Able to perform ther ex below.     Cervical / Trunk Assessment Cervical / Trunk Assessment: Other exceptions Cervical / Trunk Exceptions: Reports history of nerve problem in back at baseline   Communication   Communication: No difficulties  Cognition Arousal/Alertness: Awake/alert Behavior During Therapy: WFL for tasks assessed/performed Overall Cognitive Status: Within Functional Limits for tasks assessed                                        General Comments General comments (skin integrity, edema, etc.): Husband present during session.     Exercises Total Joint Exercises Ankle Circles/Pumps: AROM;Both;20 reps Quad Sets: AROM;Left;10 reps Towel Squeeze: AROM;Both;10 reps Short Arc Quad: AROM;Left;10 reps Hip ABduction/ADduction: AROM;Left;10 reps   Assessment/Plan    PT Assessment Patient needs continued PT services  PT Problem List Decreased strength;Decreased range of motion;Decreased activity tolerance;Decreased balance;Decreased mobility;Decreased knowledge of use of DME;Pain       PT Treatment Interventions DME instruction;Gait training;Stair training;Functional mobility training;Therapeutic activities;Therapeutic exercise;Balance training;Neuromuscular re-education;Patient/family education    PT Goals (Current goals can be found in the Care Plan section)  Acute Rehab PT Goals Patient Stated Goal: to decrease pain  PT Goal Formulation: With patient Time For Goal Achievement: 09/20/17 Potential to Achieve Goals: Good    Frequency 7X/week   Barriers to discharge        Co-evaluation               AM-PAC PT "6 Clicks" Daily Activity  Outcome Measure Difficulty turning over in bed (including adjusting bedclothes, sheets and blankets)?: A Little Difficulty  moving from lying on back to sitting on the side of the bed? : Unable Difficulty sitting down on and standing up from a chair with arms (e.g., wheelchair, bedside commode, etc,.)?: Unable Help needed moving to and from a bed to chair (including a wheelchair)?: A Little Help needed walking in hospital room?: A Little Help needed climbing 3-5 steps with a railing? : A Lot 6 Click Score: 13    End of Session Equipment Utilized During Treatment: Gait belt;Left knee immobilizer Activity Tolerance: Patient limited by pain Patient left: in chair;with call bell/phone within reach;with family/visitor present Nurse Communication: Mobility status PT Visit Diagnosis: Other abnormalities of gait and mobility (R26.89);Pain Pain - Right/Left: Left Pain - part of body: Knee    Time: 1338-1410 PT Time Calculation (min) (ACUTE ONLY): 32 min   Charges:   PT Evaluation $PT Eval Low Complexity: 1 Low PT Treatments $Therapeutic Exercise: 8-22 mins   PT G Codes:        Leighton Ruff, PT, DPT  Acute Rehabilitation Services  Pager: [phone]   Rudean Hitt 09/06/2017, 2:19 PM

## 2017-09-06 NOTE — Anesthesia Procedure Notes (Signed)
Procedure Name: MAC Date/Time: 09/06/2017 7:30 AM Performed by: Orlie Dakin Pre-anesthesia Checklist: Patient identified, Emergency Drugs available, Suction available, Patient being monitored and Timeout performed Patient Re-evaluated:Patient Re-evaluated prior to induction Oxygen Delivery Method: Nasal cannula Preoxygenation: Pre-oxygenation with 100% oxygen

## 2017-09-06 NOTE — Progress Notes (Signed)
Orthopedic Tech Progress Note Patient Details:  Melissa Hunt [facility] January 07, 1956 [id number]  CPM Left Knee CPM Left Knee: On Left Knee Flexion (Degrees): 90 Left Knee Extension (Degrees): 0 Additional Comments: Foot roll   [name] 09/06/2017, 10:29 AM

## 2017-09-06 NOTE — Evaluation (Signed)
Occupational Therapy Evaluation Patient Details Name: Melissa Hunt MRN: [id number] DOB: 1956/01/08 Today's Date: 09/06/2017    History of Present Illness Pt is a 61 y/o female s/p elective L TKA. PMH includes anxiety, HTN, fibromyalgia, and breast mass s/p excision.    Clinical Impression   PTA, pt was independent with ADL and functional mobility. She currently requires mod assist for LB ADL and min assist for ADL transfers. She presents with post-operative L knee pain and is also limited at baseline by back pain and B UE numbness at times. Pt would benefit from continued OT services while admitted to improve independence with ADL and functional mobility prior to returning home with 24 hour assistance from her husband. Discussed potential use of AE for LB ADL; however, pt would prefer to have assistance from her husband. OT will continue to follow while admitted. Do not anticipate need for OT follow-up post-acute D/C.    Follow Up Recommendations  No OT follow up;Supervision/Assistance - 24 hour    Equipment Recommendations  3 in 1 bedside commode    Recommendations for Other Services       Precautions / Restrictions Precautions Precautions: Knee Precaution Booklet Issued: No Precaution Comments: Discussed no pillow under knee Required Braces or Orthoses: Knee Immobilizer - Left Knee Immobilizer - Left: Other (comment) (until discontinued) Restrictions Weight Bearing Restrictions: Yes LLE Weight Bearing: Weight bearing as tolerated      Mobility Bed Mobility Overal bed mobility: Needs Assistance Bed Mobility: Supine to Sit     Supine to sit: Min assist     General bed mobility comments: OOB in chair on arrival.   Transfers Overall transfer level: Needs assistance Equipment used: Rolling walker (2 wheeled) Transfers: Sit to/from Stand Sit to Stand: Min assist         General transfer comment: Min assist for lifting to standing.     Balance Overall balance  assessment: Needs assistance Sitting-balance support: No upper extremity supported;Feet supported Sitting balance-Leahy Scale: Good     Standing balance support: Bilateral upper extremity supported;During functional activity Standing balance-Leahy Scale: Poor Standing balance comment: Reliant on UE support                            ADL either performed or assessed with clinical judgement   ADL Overall ADL's : Needs assistance/impaired Eating/Feeding: Set up;Sitting   Grooming: Set up;Sitting   Upper Body Bathing: Set up;Sitting   Lower Body Bathing: Moderate assistance;Sit to/from stand   Upper Body Dressing : Set up;Sitting   Lower Body Dressing: Moderate assistance;Sit to/from stand   Toilet Transfer: Ambulation;BSC;RW;Minimal assistance Toilet Transfer Details (indicate cue type and reason): Simulated; taking a few steps Toileting- Clothing Manipulation and Hygiene: Moderate assistance;Sit to/from stand       Functional mobility during ADLs: Rolling walker;Minimal assistance General ADL Comments: Min assist to rise to standing. Limited with LB dressing due to back co-morbidities.      Vision Baseline Vision/History: Wears glasses Wears Glasses: At all times Vision Assessment?: No apparent visual deficits     Perception     Praxis      Pertinent Vitals/Pain Pain Assessment: Faces Pain Score: 9  Faces Pain Scale: Hurts whole lot Pain Location: L knee  Pain Descriptors / Indicators: Aching;Operative site guarding Pain Intervention(s): Limited activity within patient's tolerance;Monitored during session;Repositioned     Hand Dominance Right   Extremity/Trunk Assessment Upper Extremity Assessment Upper Extremity Assessment: RUE deficits/detail;LUE deficits/detail  RUE Deficits / Details: Reports numbness in R hand when completing hair care LUE Deficits / Details: pt reports carpal tunnel in L hand   Lower Extremity Assessment Lower Extremity  Assessment: LLE deficits/detail LLE Deficits / Details: Decreased strength and ROM as expected post-operatively. Reports numbness in L hip.    Cervical / Trunk Assessment Cervical / Trunk Assessment: Other exceptions Cervical / Trunk Exceptions: Reports history of nerve problem in back at baseline    Communication Communication Communication: No difficulties   Cognition Arousal/Alertness: Awake/alert Behavior During Therapy: WFL for tasks assessed/performed Overall Cognitive Status: Within Functional Limits for tasks assessed                                     General Comments  Husband present during session.     Exercises Exercises: Total Joint Total Joint Exercises Ankle Circles/Pumps: AROM;Both;20 reps Quad Sets: AROM;Left;10 reps Towel Squeeze: AROM;Both;10 reps Short Arc Quad: AROM;Left;10 reps Hip ABduction/ADduction: AROM;Left;10 reps   Shoulder Instructions      Home Living Family/patient expects to be discharged to:: Private residence Living Arrangements: Spouse/significant other Available Help at Discharge: Family;Available 24 hours/day Type of Home: House Home Access: Stairs to enter [employer] of Steps: 3 Entrance Stairs-Rails:  (wide enough that walker fits on steps) Home Layout: Two level;Able to live on main level with bedroom/bathroom     Bathroom Shower/Tub: Tub/shower unit;Walk-in shower   Bathroom Toilet: Handicapped height     Home Equipment: Environmental consultant - 2 wheels;Other (comment);Shower seat;Cane - single point (CPM)          Prior Functioning/Environment Level of Independence: Needs assistance  Gait / Transfers Assistance Needed: using cane ADL's / Homemaking Assistance Needed: occasional assist for LB dressing but typically independent   Comments: Used cane for amublation         OT Problem List: Decreased strength;Decreased activity tolerance;Impaired balance (sitting and/or standing);Decreased safety  awareness;Decreased knowledge of use of DME or AE;Decreased knowledge of precautions;Pain      OT Treatment/Interventions: Self-care/ADL training;Therapeutic exercise;Energy conservation;DME and/or AE instruction;Therapeutic activities;Patient/family education;Balance training    OT Goals(Current goals can be found in the care plan section) Acute Rehab OT Goals Patient Stated Goal: to decrease pain  OT Goal Formulation: With patient/family Time For Goal Achievement: 09/20/17 Potential to Achieve Goals: Good ADL Goals Pt Will Perform Grooming: with supervision;standing Pt Will Perform Lower Body Bathing: with min assist;sit to/from stand Pt Will Perform Lower Body Dressing: with min assist;sit to/from stand Pt Will Transfer to Toilet: with supervision;ambulating;bedside commode (BSC over toilet) Pt Will Perform Toileting - Clothing Manipulation and hygiene: with supervision;sit to/from stand Pt Will Perform Tub/Shower Transfer: Shower transfer;with min guard assist;ambulating;rolling walker;shower seat;3 in 1  OT Frequency: Min 2X/week   Barriers to D/C:            Co-evaluation              AM-PAC PT "6 Clicks" Daily Activity     Outcome Measure Help from another person eating meals?: None Help from another person taking care of personal grooming?: A Little Help from another person toileting, which includes using toliet, bedpan, or urinal?: A Lot Help from another person bathing (including washing, rinsing, drying)?: A Little Help from another person to put on and taking off regular upper body clothing?: A Little Help from another person to put on and taking off regular lower body clothing?: A Lot  6 Click Score: 17   End of Session Equipment Utilized During Treatment: Rolling walker CPM Left Knee CPM Left Knee: Off  Activity Tolerance: Patient tolerated treatment well Patient left: in chair;with call bell/phone within reach;with family/visitor present  OT Visit  Diagnosis: Other abnormalities of gait and mobility (R26.89);Pain Pain - Right/Left: Left Pain - part of body: Knee                Time: 6294-7654 OT Time Calculation (min): 20 min Charges:  OT General Charges $OT Visit: 1 Visit OT Evaluation $OT Eval Moderate Complexity: 1 Mod G-Codes:     Norman Herrlich, MS OTR/L  Pager: Marissa A Calena [place] 09/06/2017, 5:13 PM

## 2017-09-06 NOTE — Anesthesia Procedure Notes (Signed)
Spinal  Patient location during procedure: OR Staffing Anesthesiologist: Lyndle Herrlich Spinal Block Patient position: sitting Prep: DuraPrep Patient monitoring: heart rate, blood pressure and continuous pulse ox Approach: right paramedian Location: L3-4 Injection technique: single-shot Needle Needle type: Sprotte  Needle gauge: 24 G Needle length: 9 cm Assessment Sensory level: T4 Additional Notes Spinal Dosage in OR  .75% Bupivicaine ml       1.8

## 2017-09-07 ENCOUNTER — Encounter (HOSPITAL_COMMUNITY): Payer: Self-pay | Admitting: Orthopedic Surgery

## 2017-09-07 LAB — BASIC METABOLIC PANEL
ANION GAP: 6 (ref 5–15)
BUN: 17 mg/dL (ref 6–20)
CALCIUM: 8.6 mg/dL — AB (ref 8.9–10.3)
CO2: 25 mmol/L (ref 22–32)
Chloride: 106 mmol/L (ref 101–111)
Creatinine, Ser: 0.77 mg/dL (ref 0.44–1.00)
GFR calc Af Amer: >60 mL/min (ref >60)
GFR calc non Af Amer: >60 mL/min (ref >60)
Glucose, Bld: 152 mg/dL — ABNORMAL HIGH (ref 65–99)
Potassium: 4.6 mmol/L (ref 3.5–5.1)
Sodium: 137 mmol/L (ref 135–145)

## 2017-09-07 LAB — CBC
HCT: 33.2 % — ABNORMAL LOW (ref 36.0–46.0)
Hemoglobin: 10.8 g/dL — ABNORMAL LOW (ref 12.0–15.0)
MCH: 30.7 pg (ref 26.0–34.0)
MCHC: 32.5 g/dL (ref 30.0–36.0)
MCV: 94.3 fL (ref 78.0–100.0)
Platelets: 168 10*3/uL (ref 150–400)
RBC: 3.52 MIL/uL — ABNORMAL LOW (ref 3.87–5.11)
RDW: 13.4 % (ref 11.5–15.5)
WBC: 13.6 10*3/uL — AB (ref 4.0–10.5)

## 2017-09-07 MED ORDER — APIXABAN 2.5 MG PO TABS: 2.5000 mg | ORAL_TABLET | Freq: Two times a day (BID) | ORAL | 0 refills | Status: DC

## 2017-09-07 MED ORDER — DOCUSATE SODIUM 100 MG PO CAPS: ORAL_CAPSULE | ORAL | 0 refills | Status: DC

## 2017-09-07 MED ORDER — OXYCODONE HCL 5 MG PO TABS: ORAL_TABLET | ORAL | 0 refills | Status: DC

## 2017-09-07 MED ORDER — ACETAMINOPHEN 325 MG PO TABS: 650.0000 mg | ORAL_TABLET | ORAL | Status: DC | PRN

## 2017-09-07 MED ORDER — POLYETHYLENE GLYCOL 3350 17 G PO PACK: PACK | ORAL | 0 refills | Status: DC

## 2017-09-07 NOTE — Progress Notes (Signed)
Physical Therapy Treatment Patient Details Name: Melissa Hunt MRN: [id number] DOB: 10-28-1956 Today's Date: 09/07/2017    History of Present Illness Pt is a 61 y/o female s/p elective L TKA. PMH includes anxiety, HTN, fibromyalgia, and breast mass s/p excision.     PT Comments    Pt seated in chair upon arrival. BP taken in sitting and standing; WNL and no dizziness. Agreeable to ambulate; able to ambulate for further distance. Required cues for knee flexion and sequencing while ambulating. Educated on importance of stretching knee through flexion; not leaving knee in semi flexed position. Let RN know pt's IV needed to be flushed. Plan to do stairs and exercises this afternoon.  Follow Up Recommendations  DC plan and follow up therapy as arranged by surgeon;Supervision for mobility/OOB     Equipment Recommendations  Rolling walker with 5" wheels    Recommendations for Other Services       Precautions / Restrictions Precautions Precautions: Knee Precaution Booklet Issued: No Precaution Comments: discussed zero foam use Required Braces or Orthoses: Knee Immobilizer - Left Knee Immobilizer - Left: Other (comment) Restrictions Weight Bearing Restrictions: Yes LLE Weight Bearing: Weight bearing as tolerated    Mobility  Bed Mobility Overal bed mobility: Needs Assistance             General bed mobility comments: OOB in chair on arrival.  Transfers Overall transfer level: Needs assistance Equipment used: Rolling walker (2 wheeled) Transfers: Sit to/from Stand Sit to Stand: Min guard         General transfer comment: Min guard for safety. Pt required extra time and effort sit to stand.  Ambulation/Gait Ambulation/Gait assistance: Min guard Ambulation Distance (Feet): 120 Feet Assistive device: Rolling walker (2 wheeled) Gait Pattern/deviations: Step-through pattern;Decreased weight shift to left;Decreased stance time - left;Decreased step length -  right;Antalgic Gait velocity: Decreased   General Gait Details: VCs for sequencing with RW. VCs for L knee flexion during swing phase. Slow, guarded gait.    Stairs            Wheelchair Mobility    Modified Rankin (Stroke Patients Only)       Balance Overall balance assessment: Needs assistance Sitting-balance support: No upper extremity supported;Feet supported Sitting balance-Leahy Scale: Good     Standing balance support: Bilateral upper extremity supported Standing balance-Leahy Scale: Poor Standing balance comment: Reliant on UE support                             Cognition Arousal/Alertness: Awake/alert Behavior During Therapy: WFL for tasks assessed/performed Overall Cognitive Status: Within Functional Limits for tasks assessed                                        Exercises Total Joint Exercises Ankle Circles/Pumps: AROM;Both;20 reps;Supine Long Arc Quad: AROM;Left;15 reps;Seated Knee Flexion: AAROM;Left;Seated (30 seconds x 2)    General Comments        Pertinent Vitals/Pain Pain Assessment: 0-10 Pain Score: 9  Pain Location: L knee  Pain Descriptors / Indicators: Aching;Grimacing;Guarding Pain Intervention(s): Limited activity within patient's tolerance;Monitored during session;Repositioned;Ice applied    Home Living                      Prior Function            PT Goals (current goals can  now be found in the care plan section) Acute Rehab PT Goals Patient Stated Goal: to decrease pain  PT Goal Formulation: With patient Time For Goal Achievement: 09/20/17 Potential to Achieve Goals: Good Progress towards PT goals: Progressing toward goals    Frequency    7X/week      PT Plan Current plan remains appropriate    Co-evaluation              AM-PAC PT "6 Clicks" Daily Activity  Outcome Measure  Difficulty turning over in bed (including adjusting bedclothes, sheets and blankets)?: A  Little Difficulty moving from lying on back to sitting on the side of the bed? : Unable Difficulty sitting down on and standing up from a chair with arms (e.g., wheelchair, bedside commode, etc,.)?: A Lot Help needed moving to and from a bed to chair (including a wheelchair)?: A Little Help needed walking in hospital room?: A Little Help needed climbing 3-5 steps with a railing? : A Lot 6 Click Score: 14    End of Session Equipment Utilized During Treatment: Gait belt Activity Tolerance: Patient tolerated treatment well Patient left: in chair;with call bell/phone within reach;with family/visitor present Nurse Communication: Other (comment) (IV flush) PT Visit Diagnosis: Other abnormalities of gait and mobility (R26.89);Pain Pain - Right/Left: Left Pain - part of body: Knee     Time: 6979-4801 PT Time Calculation (min) (ACUTE ONLY): 40 min  Charges:   $Gait Training (23-37 minutes) $Therapeutic Exercise (8-22 minutes)                    G Codes:       Janna Arch, SPTA   Janna Arch 09/07/2017, 10:26 AM

## 2017-09-07 NOTE — Anesthesia Postprocedure Evaluation (Signed)
Anesthesia Post Note  Patient: Melissa Hunt  Procedure(s) Performed: TOTAL KNEE ARTHROPLASTY (Left )     Patient location during evaluation: PACU Anesthesia Type: Spinal Level of consciousness: awake Pain management: satisfactory to patient Vital Signs Assessment: post-procedure vital signs reviewed and stable Respiratory status: spontaneous breathing Cardiovascular status: blood pressure returned to baseline Postop Assessment: no headache and spinal receding Anesthetic complications: no    Last Vitals:  Vitals:   09/06/17 2352 09/07/17 0416  BP: (!) 112/59 106/73  Pulse: 75 70  Resp: 18 18  Temp: 36.9 C 36.8 C  SpO2: 97% 97%    Last Pain:  Vitals:   09/07/17 0610  TempSrc:   PainSc: 6                  Kerilyn Cortner EDWARD

## 2017-09-07 NOTE — Progress Notes (Signed)
Physical Therapy Treatment Patient Details Name: Melissa Hunt MRN: [id number] DOB: 10-15-1956 Today's Date: 09/07/2017    History of Present Illness Pt is a 61 y/o female s/p elective L TKA. PMH includes anxiety, HTN, fibromyalgia, and breast mass s/p excision.     PT Comments    Pt tolerated increased gait distance this afternoon as well as stair practice. Pt continues to feel slightly faint with continued exertion, discussed seated rest breaks when she feels this way and she sat before and after practicing stairs today. Should be ready for d/c home tomorrow after PT. Discussed home activity level as well as car transfer. PT will continue to follow.    Follow Up Recommendations  DC plan and follow up therapy as arranged by surgeon;Supervision for mobility/OOB     Equipment Recommendations  Rolling walker with 5" wheels    Recommendations for Other Services       Precautions / Restrictions Precautions Precautions: Knee Precaution Booklet Issued: No Required Braces or Orthoses: Knee Immobilizer - Left Knee Immobilizer - Left: Other (comment) (did not use due to sufficient quad) Restrictions Weight Bearing Restrictions: Yes LLE Weight Bearing: Weight bearing as tolerated    Mobility  Bed Mobility Overal bed mobility: Needs Assistance Bed Mobility: Supine to Sit;Sit to Supine     Supine to sit: Min assist Sit to supine: Min assist   General bed mobility comments: min A to RLE to get in and out of CPM. Pt able to manage leg once to EOB  Transfers Overall transfer level: Needs assistance Equipment used: Rolling walker (2 wheeled) Transfers: Sit to/from Stand Sit to Stand: Min guard         General transfer comment: continued min-guard for safety as pt has h/o passing out and has been feeling whoozy today.   Ambulation/Gait Ambulation/Gait assistance: Min guard Ambulation Distance (Feet): 150 Feet Assistive device: Rolling walker (2 wheeled) Gait  Pattern/deviations: Step-through pattern;Decreased weight shift to left;Decreased stance time - left;Decreased step length - right;Antalgic Gait velocity: Decreased Gait velocity interpretation: <1.8 ft/sec, indicative of risk for recurrent falls General Gait Details: reliance on RW for support, vc's for upright posture   Stairs Stairs: Yes   Stair Management: No rails;Step to pattern;Forwards Number of Stairs: 2 General stair comments: pt has steps wide enough to get full RW on so practiced one step 2x. Safe with sequencing and husband present for training  Wheelchair Mobility    Modified Rankin (Stroke Patients Only)       Balance Overall balance assessment: Needs assistance Sitting-balance support: No upper extremity supported;Feet supported Sitting balance-Leahy Scale: Good     Standing balance support: Bilateral upper extremity supported Standing balance-Leahy Scale: Poor Standing balance comment: Reliant on UE support                             Cognition Arousal/Alertness: Awake/alert Behavior During Therapy: WFL for tasks assessed/performed Overall Cognitive Status: Within Functional Limits for tasks assessed                                        Exercises Total Joint Exercises Ankle Circles/Pumps: AROM;Both;20 reps;Supine Long Arc Quad: AROM;Left;10 reps;Seated Knee Flexion: AROM;Left;10 reps;Seated Goniometric ROM: 5-80 Marching in Standing: AROM;Both;10 reps;Standing Standing Hip Extension: AROM;Left;10 reps;Standing    General Comments        Pertinent Vitals/Pain Pain Assessment:  0-10 Pain Score: 6  Pain Location: L knee  Pain Descriptors / Indicators: Aching Pain Intervention(s): Limited activity within patient's tolerance;Monitored during session;Premedicated before session    Home Living                      Prior Function            PT Goals (current goals can now be found in the care plan section)  Acute Rehab PT Goals Patient Stated Goal: to decrease pain  PT Goal Formulation: With patient Time For Goal Achievement: 09/20/17 Potential to Achieve Goals: Good Progress towards PT goals: Progressing toward goals    Frequency    7X/week      PT Plan Current plan remains appropriate    Co-evaluation              AM-PAC PT "6 Clicks" Daily Activity  Outcome Measure  Difficulty turning over in bed (including adjusting bedclothes, sheets and blankets)?: A Little Difficulty moving from lying on back to sitting on the side of the bed? : A Little Difficulty sitting down on and standing up from a chair with arms (e.g., wheelchair, bedside commode, etc,.)?: A Little     Help needed climbing 3-5 steps with a railing? : A Little 6 Click Score: 12    End of Session Equipment Utilized During Treatment: Gait belt Activity Tolerance: Patient tolerated treatment well Patient left: with call bell/phone within reach;with family/visitor present;in bed;in CPM Nurse Communication: Mobility status PT Visit Diagnosis: Other abnormalities of gait and mobility (R26.89);Pain Pain - Right/Left: Left Pain - part of body: Knee     Time: 5597-4163 PT Time Calculation (min) (ACUTE ONLY): 32 min  Charges:  $Gait Training: 23-37 mins                    G Codes:       Kenvir  Haysi 09/07/2017, 4:25 PM

## 2017-09-07 NOTE — Discharge Instructions (Addendum)

## 2017-09-07 NOTE — Progress Notes (Signed)
Subjective: 1 Day Post-Op Procedure(s) (LRB): TOTAL KNEE ARTHROPLASTY (Left) Patient reports pain as 6 on 0-10 scale.    Objective: Vital signs in last 24 hours: Temp:  [97.9 F (36.6 C)-98.5 F (36.9 C)] 98.3 F (36.8 C) (10/30 0416) Pulse Rate:  [65-79] 70 (10/30 0416) Resp:  [10-22] 18 (10/30 0416) BP: (106-127)/(58-108) 106/73 (10/30 0416) SpO2:  [90 %-100 %] 97 % (10/30 0416)  Intake/Output from previous day: 10/29 0701 - 10/30 0700 In: 2241.7 [P.O.:480; I.V.:1661.7; IV Piggyback:100] Out: 1350 [Urine:1350] Intake/Output this shift: No intake/output data recorded.   Recent Labs  09/07/17 0527  HGB 10.8*    Recent Labs  09/07/17 0527  WBC 13.6*  RBC 3.52*  HCT 33.2*  PLT 168    Recent Labs  09/07/17 0527  NA 137  K 4.6  CL 106  CO2 25  BUN 17  CREATININE 0.77  GLUCOSE 152*  CALCIUM 8.6*   No results for input(s): LABPT, INR in the last 72 hours.  ABD soft Neurovascular intact Sensation intact distally Intact pulses distally Dorsiflexion/Plantar flexion intact Incision: dressing C/D/I and no drainage  Assessment/Plan: 1 Day Post-Op Procedure(s) (LRB): TOTAL KNEE ARTHROPLASTY (Left)  Principal Problem:   Primary localized osteoarthritis of left knee  Advance diet Up with therapy D/C IV fluids Discharge home with home health  If patient progresses well with Physical therapy today she can go home this afternoon after two session.  If there are issues will keep another night.  Vinod Mikesell J 09/07/2017, 8:43 AM

## 2017-09-07 NOTE — Care Management Note (Signed)
Case Management Note  Patient Details  Name: Melissa Hunt MRN: [id number] Date of Birth: 03-01-1956  Subjective/Objective:                    Action/Plan:   Expected Discharge Date:  09/08/17               Expected Discharge Plan:  [place]  In-House Referral:     Discharge planning Services  CM Consult  Post Acute Care Choice:  Home Health, Durable Medical Equipment Choice offered to:  Patient  DME Arranged:  3-N-1, Walker rolling, Continuous passive motion machine DME Agency:  [place]:  PT [place] Agency:  [facility] (now [facility])  Status of Service:  Completed, signed off  If discussed at [place] of Stay Meetings, dates discussed:    Additional Comments:  Marilu Favre, RN 09/07/2017, 11:48 AM

## 2017-09-07 NOTE — Progress Notes (Addendum)
OT Cancellation Note  Patient Details Name: Elsia Lasota MRN: [id number] DOB: July 26, 1956   Cancelled Treatment:    Reason Eval/Treat Not Completed: Pain limiting ability to participate. Pt painful after just finishing PT for second walk of the morning. Will check back as able to address shower transfers.   Addendum 15:30. Checked back in and pt had just returned to bed with CPM applied. Will check back as able.   Norman Herrlich, MS OTR/L  Pager: [place] 09/07/2017, 10:14 AM

## 2017-09-08 LAB — BASIC METABOLIC PANEL
Anion gap: 8 (ref 5–15)
BUN: 19 mg/dL (ref 6–20)
CHLORIDE: 104 mmol/L (ref 101–111)
CO2: 24 mmol/L (ref 22–32)
CREATININE: 0.67 mg/dL (ref 0.44–1.00)
Calcium: 8.5 mg/dL — ABNORMAL LOW (ref 8.9–10.3)
GFR calc non Af Amer: >60 mL/min (ref >60)
GLUCOSE: 139 mg/dL — AB (ref 65–99)
Potassium: 4.8 mmol/L (ref 3.5–5.1)
Sodium: 136 mmol/L (ref 135–145)

## 2017-09-08 LAB — CBC
HEMATOCRIT: 28.9 % — AB (ref 36.0–46.0)
HEMOGLOBIN: 9.5 g/dL — AB (ref 12.0–15.0)
MCH: 30.9 pg (ref 26.0–34.0)
MCHC: 32.9 g/dL (ref 30.0–36.0)
MCV: 94.1 fL (ref 78.0–100.0)
Platelets: 161 10*3/uL (ref 150–400)
RBC: 3.07 MIL/uL — ABNORMAL LOW (ref 3.87–5.11)
RDW: 13.5 % (ref 11.5–15.5)
WBC: 12 10*3/uL — ABNORMAL HIGH (ref 4.0–10.5)

## 2017-09-08 NOTE — Progress Notes (Signed)
Discharge instructions reviewed with pt and husband.  Copy of instructions and scripts given to pt.  Pt d/c'd via wheelchair with belongings, with husband.            Escorted by hospital volunteer.

## 2017-09-08 NOTE — Progress Notes (Addendum)
Physical Therapy Treatment Patient Details Name: Melissa Hunt MRN: [id number] DOB: 26-Feb-1956 Today's Date: 09/08/2017    History of Present Illness Pt is a 61 y/o female s/p elective L TKA. PMH includes anxiety, HTN, fibromyalgia, and breast mass s/p excision.     PT Comments    Pt was seen for visit with good follow through on gait, but did not need to use stairs due to her previous PT visit.  Pt is doing well with 75 deg flexion on L knee and could compensate well with controlling descent to chair to sit.  Will plan for her to have therapy as ordered by surgeon, expecting HHPT per pt.  Follow Up Recommendations  DC plan and follow up therapy as arranged by surgeon     Equipment Recommendations  Rolling walker with 5" wheels    Recommendations for Other Services       Precautions / Restrictions Precautions Precautions: Knee Precaution Booklet Issued: No Required Braces or Orthoses: Knee Immobilizer - Left (no longer needed) Knee Immobilizer - Left: Other (comment) (not needed today) Restrictions Weight Bearing Restrictions: Yes LLE Weight Bearing: Weight bearing as tolerated    Mobility  Bed Mobility Overal bed mobility: Needs Assistance Bed Mobility: Supine to Sit;Sit to Supine     Supine to sit: Min guard;Min assist Sit to supine: Min guard;Min assist   General bed mobility comments: HOB elevated minimally  Transfers Overall transfer level: Modified independent Equipment used: Rolling walker (2 wheeled) Transfers: Sit to/from Stand Sit to Stand: Min guard         General transfer comment: reminded for hand placement  Ambulation/Gait Ambulation/Gait assistance: Min guard Ambulation Distance (Feet): 150 Feet Assistive device: Rolling walker (2 wheeled) Gait Pattern/deviations: Step-through pattern;Decreased stride length;Decreased weight shift to left;Wide base of support;Trunk flexed Gait velocity: Decreased Gait velocity interpretation: Below  normal speed for age/gender General Gait Details: requires RW for support of gait   Stairs Stairs:  (did them yesterday)          Wheelchair Mobility    Modified Rankin (Stroke Patients Only)       Balance Overall balance assessment: Modified Independent Sitting-balance support: No upper extremity supported;Feet supported Sitting balance-Leahy Scale: Good       Standing balance-Leahy Scale: Fair Standing balance comment: using walker for pain but very steady with walker for short trips                            Cognition Arousal/Alertness: Awake/alert Behavior During Therapy: WFL for tasks assessed/performed Overall Cognitive Status: Within Functional Limits for tasks assessed                                        Exercises Total Joint Exercises Ankle Circles/Pumps: AROM;Both;5 reps Quad Sets: AROM;Both;10 reps Gluteal Sets: AROM;Both;10 reps Heel Slides: AROM;Both;10 reps Hip ABduction/ADduction: AROM;Left;10 reps    General Comments        Pertinent Vitals/Pain Pain Assessment: 0-10 Pain Score: 4  Faces Pain Scale: Hurts little more Pain Location: L knee  Pain Descriptors / Indicators: Operative site guarding;Spasm;Sharp Pain Intervention(s): Limited activity within patient's tolerance;Monitored during session;Premedicated before session;Repositioned    Home Living                      Prior Function  PT Goals (current goals can now be found in the care plan section) Acute Rehab PT Goals Patient Stated Goal: to return home Progress towards PT goals: Progressing toward goals    Frequency    7X/week      PT Plan Current plan remains appropriate    Co-evaluation              AM-PAC PT "6 Clicks" Daily Activity  Outcome Measure  Difficulty turning over in bed (including adjusting bedclothes, sheets and blankets)?: A Little Difficulty moving from lying on back to sitting on the side  of the bed? : A Little Difficulty sitting down on and standing up from a chair with arms (e.g., wheelchair, bedside commode, etc,.)?: A Little Help needed moving to and from a bed to chair (including a wheelchair)?: A Little Help needed walking in hospital room?: A Little Help needed climbing 3-5 steps with a railing? : A Little 6 Click Score: 18    End of Session Equipment Utilized During Treatment: Gait belt Activity Tolerance: Patient tolerated treatment well (nausea and nearly vomiting) Patient left: with call bell/phone within reach;with nursing/sitter in room;in chair Nurse Communication: Mobility status PT Visit Diagnosis: Other abnormalities of gait and mobility (R26.89);Pain;Muscle weakness (generalized) (M62.81) Pain - Right/Left: Left Pain - part of body: Knee     Time: 1125-1150 PT Time Calculation (min) (ACUTE ONLY): 25 min  Charges:  $Therapeutic Exercise: 8-22 mins $Therapeutic Activity: 8-22 mins                    G Codes:  Functional Assessment Tool Used: AM-PAC 6 Clicks Basic Mobility     Ramond Dial 09/08/2017, 2:28 PM   Mee Hives, PT MS Acute Rehab Dept. Number: [place] and [place]

## 2017-09-08 NOTE — Discharge Summary (Signed)
Patient ID: Melissa Hunt MRN: [id number] DOB/AGE: 1956-07-25 61 y.o.  Admit date: 09/06/2017 Discharge date: 09/08/2017  Admission Diagnoses:  Principal Problem:   Primary localized osteoarthritis of left knee   Discharge Diagnoses:  Same  Past Medical History:  Diagnosis Date  . Anxiety   . Arthritis 2008  . Breast discharge 2 weeks ago   LEFT BREAST BLOODY  . Fibromyalgia   . Hypertension 2000  . Hypothyroidism   . Lump or mass in breast   . Neoplasm of uncertain behavior of breast   . 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 Primary localized osteoarthritis of left knee 09/06/2017    Surgeries: Procedure(s): TOTAL KNEE ARTHROPLASTY on 09/06/2017   Consultants:   Discharged Condition: Improved  Hospital Course: Melissa Hunt is an 61 y.o. female who was admitted 09/06/2017 for operative treatment ofPrimary localized osteoarthritis of left knee. Patient has severe unremitting pain that affects sleep, daily activities, and work/hobbies. After pre-op clearance the patient was taken to the operating room on 09/06/2017 and underwent  Procedure(s): TOTAL KNEE ARTHROPLASTY.    Patient was given perioperative antibiotics: Anti-infectives    Start     Dose/Rate Route Frequency Ordered Stop   09/06/17 1300  ceFAZolin (ANCEF) IVPB 2g/100 mL premix     2 g 200 mL/hr over 30 Minutes Intravenous Every 6 hours 09/06/17 1050 09/06/17 2033   09/06/17 0700  ceFAZolin (ANCEF) IVPB 2g/100 mL premix     2 g 200 mL/hr over 30 Minutes Intravenous To ShortStay [facility] 09/03/17 1239 09/06/17 0710       Patient was given sequential compression devices, early ambulation, and chemoprophylaxis to prevent DVT.  Patient benefited maximally from hospital stay and there were no complications.    Recent vital signs: Patient Vitals for the past 24 hrs:  BP Temp Temp src Pulse Resp SpO2  09/08/17 0345 (!) 134/56 97.9 F (36.6 C) Oral - 14 100 %  09/07/17 2141  (!) 112/56 97.8 F (36.6 C) Oral 64 16 97 %  09/07/17 1625 110/61 98.3 F (36.8 C) Oral 66 - 99 %  09/07/17 1224 135/83 98.7 F (37.1 C) Oral 62 - 100 %  09/07/17 0851 (!) 112/55 - - (!) 58 - -  09/07/17 0849 (!) 101/58 98.3 F (36.8 C) Oral 64 - 100 %     Recent laboratory studies:  Recent Labs  09/07/17 0527 09/08/17 0456  WBC 13.6* 12.0*  HGB 10.8* 9.5*  HCT 33.2* 28.9*  PLT 168 161  NA 137 136  K 4.6 4.8  CL 106 104  CO2 25 24  BUN 17 19  CREATININE 0.77 0.67  GLUCOSE 152* 139*  CALCIUM 8.6* 8.5*     Discharge Medications:   Allergies as of 09/08/2017   No Known Allergies     Medication List    STOP taking these medications   cyclobenzaprine 10 MG tablet Commonly known as:  FLEXERIL   traMADol 50 MG tablet Commonly known as:  ULTRAM     TAKE these medications   acetaminophen 325 MG tablet Commonly known as:  TYLENOL Take 2 tablets (650 mg total) by mouth every 4 (four) hours as needed for mild pain ((score 1 to 3) or temp > 100.5).   apixaban 2.5 MG Tabs tablet Commonly known as:  ELIQUIS Take 1 tablet (2.5 mg total) by mouth every 12 (twelve) hours.   clobetasol cream 0.05 % Commonly known as:  TEMOVATE Apply 1 application topically  2 (two) times daily as needed.   docusate sodium 100 MG capsule Commonly known as:  COLACE 1 tab 2 times a day while on narcotics.  STOOL SOFTENER   FLUoxetine 40 MG capsule Commonly known as:  PROZAC Take 40 mg by mouth daily.   hydrochlorothiazide 12.5 MG capsule Commonly known as:  MICROZIDE Take 12.5 mg by mouth daily as needed.   levothyroxine 150 MCG tablet Commonly known as:  SYNTHROID, LEVOTHROID Take 150 mcg by mouth daily before breakfast.   MAGNESIUM CITRATE PO Take 1 capsule by mouth daily.   multivitamin with minerals tablet Take 1 tablet by mouth daily.   olmesartan 20 MG tablet Commonly known as:  BENICAR Take 10 mg by mouth daily.   oxyCODONE 5 MG immediate release tablet Commonly  known as:  Oxy IR/ROXICODONE 1-2 tablets every 4-6 hrs as needed for pain   polyethylene glycol packet Commonly known as:  MIRALAX / GLYCOLAX 17grams in 6 oz of water twice a day until bowel movement.  LAXITIVE.  Restart if two days since last bowel movement   tiZANidine 4 MG tablet Commonly known as:  ZANAFLEX TAKE 1/2 TO 1 TABLET BY MOUTH TWICE A DAY AS NEEDED FOR SPASM (MAY CAUSE DROWSINESS) (MAIL REQ)   Vitamin D3 2000 units Tabs Take 1 capsule by mouth daily.            Discharge Care Instructions        Start     Ordered   09/07/17 0000  Change dressing    Comments:  DO NOT REMOVE BANDAGE OVER SURGICAL INCISION.  [place] WHOLE LEG INCLUDING OVER THE WATERPROOF BANDAGE WITH SOAP AND WATER EVERY DAY.   09/07/17 0850      Diagnostic Studies: US Breast Complete Uni Left Inc Axilla  Result Date: 08/11/2017 Ultrasound examination was completed today due to the patient's report of recurrent nipple drainage and the density noted on serial mammograms. In the retroareolar area at the 1:00 position 1 cm from the nipple there is a hypoechoic area measuring up to 0.67 cm without increased vascularity and without direct continuity with any ducts. No significant ductal dilatation. BI-RADS-4. At the 9:00 position 5 synovator some nipple there is a hyperechoic area with posterior shadowing which may likely represent the previously placed biopsy clip. The said defect measures 0.347 m in maximal diameter. BI-RADS-2.    Disposition:   Discharge Instructions    CPM    Complete by:  As directed    Continuous passive motion machine (CPM):      Use the CPM from 0 to 90 for 6 hours per day.       You may break it up into 2 or 3 sessions per day.      Use CPM for 2 weeks or until you are told to stop.   Call MD / Call 911    Complete by:  As directed    If you experience chest pain or shortness of breath, CALL 911 and be transported to the hospital emergency room.  If you develope a fever  above 101 F, pus (white drainage) or increased drainage or redness at the wound, or calf pain, call your surgeon's office.   Change dressing    Complete by:  As directed    DO NOT REMOVE BANDAGE OVER SURGICAL INCISION.  [place] WHOLE LEG INCLUDING OVER THE WATERPROOF BANDAGE WITH SOAP AND WATER EVERY DAY.   Constipation Prevention    Complete by:  As directed  Drink plenty of fluids.  Prune juice may be helpful.  You may use a stool softener, such as Colace (over the counter) 100 mg twice a day.  Use MiraLax (over the counter) for constipation as needed.   Diet - low sodium heart healthy    Complete by:  As directed    Discharge instructions    Complete by:  As directed    INSTRUCTIONS AFTER JOINT REPLACEMENT   Remove items at home which could result in a fall. This includes throw rugs or furniture in walking pathways ICE to the affected joint every three hours while awake for 30 minutes at a time, for at least the first 3-5 days, and then as needed for pain and swelling.  Continue to use ice for pain and swelling. You may notice swelling that will progress down to the foot and ankle.  This is normal after surgery.  Elevate your leg when you are not up walking on it.   Continue to use the breathing machine you got in the hospital (incentive spirometer) which will help keep your temperature down.  It is common for your temperature to cycle up and down following surgery, especially at night when you are not up moving around and exerting yourself.  The breathing machine keeps your lungs expanded and your temperature down.   DIET:  As you were doing prior to hospitalization, we recommend a well-balanced diet.  DRESSING / WOUND CARE / SHOWERING  Keep the surgical dressing until follow up.  The dressing is water proof, so you can shower without any extra covering.  IF THE DRESSING FALLS OFF or the wound gets wet inside, change the dressing with sterile gauze.  Please use good hand washing techniques  before changing the dressing.  Do not use any lotions or creams on the incision until instructed by your surgeon.    ACTIVITY  Increase activity slowly as tolerated, but follow the weight bearing instructions below.   No driving for 6 weeks or until further direction given by your physician.  You cannot drive while taking narcotics.  No lifting or carrying greater than 10 lbs. until further directed by your surgeon. Avoid periods of inactivity such as sitting longer than an hour when not asleep. This helps prevent blood clots.  You may return to work once you are authorized by your doctor.     WEIGHT BEARING   Weight bearing as tolerated with assist device (walker, cane, etc) as directed, use it as long as suggested by your surgeon or therapist, typically at least 2-3 weeks.   EXERCISES  Results after joint replacement surgery are often greatly improved when you follow the exercise, range of motion and muscle strengthening exercises prescribed by your doctor. Safety measures are also important to protect the joint from further injury. Any time any of these exercises cause you to have increased pain or swelling, decrease what you are doing until you are comfortable again and then slowly increase them. If you have problems or questions, call your caregiver or physical therapist for advice.   Rehabilitation is important following a joint replacement. After just a few days of immobilization, the muscles of the leg can become weakened and shrink (atrophy).  These exercises are designed to build up the tone and strength of the thigh and leg muscles and to improve motion. Often times heat used for twenty to thirty minutes before working out will loosen up your tissues and help with improving the range of motion but do  not use heat for the first two weeks following surgery (sometimes heat can increase post-operative swelling).   These exercises can be done on a training (exercise) mat, on the floor,  on a table or on a bed. Use whatever works the best and is most comfortable for you.    Use music or television while you are exercising so that the exercises are a pleasant break in your day. This will make your life better with the exercises acting as a break in your routine that you can look forward to.   Perform all exercises about fifteen times, three times per day or as directed.  You should exercise both the operative leg and the other leg as well.   Exercises include:  Quad Sets - Tighten up the muscle on the front of the thigh (Quad) and hold for 5-10 seconds.   Straight Leg Raises - With your knee straight (if you were given a brace, keep it on), lift the leg to 60 degrees, hold for 3 seconds, and slowly lower the leg.  Perform this exercise against resistance later as your leg gets stronger.  Leg Slides: Lying on your back, slowly slide your foot toward your buttocks, bending your knee up off the floor (only go as far as is comfortable). Then slowly slide your foot back down until your leg is flat on the floor again.  Angel Wings: Lying on your back spread your legs to the side as far apart as you can without causing discomfort.  Hamstring Strength:  Lying on your back, push your heel against the floor with your leg straight by tightening up the muscles of your buttocks.  Repeat, but this time bend your knee to a comfortable angle, and push your heel against the floor.  You may put a pillow under the heel to make it more comfortable if necessary.   A rehabilitation program following joint replacement surgery can speed recovery and prevent re-injury in the future due to weakened muscles. Contact your doctor or a physical therapist for more information on knee rehabilitation.    CONSTIPATION  Constipation is defined medically as fewer than three stools per week and severe constipation as less than one stool per week.  Even if you have a regular bowel pattern at home, your normal regimen is  likely to be disrupted due to multiple reasons following surgery.  Combination of anesthesia, postoperative narcotics, change in appetite and fluid intake all can affect your bowels.   YOU MUST use at least one of the following options; they are listed in order of increasing strength to get the job done.  They are all available over the counter, and you may need to use some, POSSIBLY even all of these options:    Drink plenty of fluids (prune juice may be helpful) and high fiber foods Colace 100 mg by mouth twice a day  Senokot for constipation as directed and as needed Dulcolax (bisacodyl), take with full glass of water  Miralax (polyethylene glycol) once or twice a day as needed.  If you have tried all these things and are unable to have a bowel movement in the first 3-4 days after surgery call either your surgeon or your primary doctor.    If you experience loose stools or diarrhea, hold the medications until you stool forms back up.  If your symptoms do not get better within 1 week or if they get worse, check with your doctor.  If you experience "the worst abdominal pain  ever" or develop nausea or vomiting, please contact the office immediately for further recommendations for treatment.   ITCHING:  If you experience itching with your medications, try taking only a single pain pill, or even half a pain pill at a time.  You can also use Benadryl over the counter for itching or also to help with sleep.   TED HOSE STOCKINGS:  Use stockings on both legs until for at least 2 weeks or as directed by physician office. They may be removed at night for sleeping.  MEDICATIONS:  See your medication summary on the "After Visit Summary" that nursing will review with you.  You may have some home medications which will be placed on hold until you complete the course of blood thinner medication.  It is important for you to complete the blood thinner medication as prescribed.  PRECAUTIONS:  If you experience  chest pain or shortness of breath - call 911 immediately for transfer to the hospital emergency department.   If you develop a fever greater that 101 F, purulent drainage from wound, increased redness or drainage from wound, foul odor from the wound/dressing, or calf pain - CONTACT YOUR SURGEON.                                                   FOLLOW-UP APPOINTMENTS:  If you do not already have a post-op appointment, please call the office for an appointment to be seen by your surgeon.  Guidelines for how soon to be seen are listed in your "After Visit Summary", but are typically between 1-4 weeks after surgery.  OTHER INSTRUCTIONS:   Knee Replacement:  Do not place pillow under knee, focus on keeping the knee straight while resting. CPM instructions: 0-90 degrees, 2 hours in the morning, 2 hours in the afternoon, and 2 hours in the evening. Place foam block, curve side up under heel at all times except when in CPM or when walking.  DO NOT modify, tear, cut, or change the foam block in any way.  MAKE SURE YOU:  Understand these instructions.  Get help right away if you are not doing well or get worse.    Thank you for letting us be a part of your medical care team.  It is a privilege we respect greatly.  We hope these instructions will help you stay on track for a fast and full recovery!   Do not put a pillow under the knee. Place it under the heel.    Complete by:  As directed    Place gray foam block, curve side up under heel at all times except when in CPM or when walking.  DO NOT modify, tear, cut, or change in any way the gray foam block.   Increase activity slowly as tolerated    Complete by:  As directed    Patient may shower    Complete by:  As directed    Aquacel dressing is water proof    Wash over it and the whole leg with soap and water at the end of your shower   TED hose    Complete by:  As directed    Use stockings (TED hose) for 2 weeks on both leg(s).  You may remove them  at night for sleeping.      Follow-up Information    Noemi Chapel,  Herbie Baltimore, MD Follow up on 09/20/2017.   Specialty:  Orthopedic Surgery Why:  appt time 2:15 pm Contact information: [place] [address] [phone]        Elsie Saas, MD Follow up on 09/21/2017.   Specialty:  Orthopedic Surgery Why:  appt at [facility] in [place] [address]    Appointment time 9:30 checkin for 10 am appt. Contact information: [place] [address] [phone]            Signed: Linda Hedges 09/08/2017, 7:36 AM

## 2017-09-08 NOTE — Progress Notes (Signed)
Occupational Therapy Treatment Patient Details Name: Melissa Hunt MRN: [id number] DOB: Sep 19, 1956 Today's Date: 09/08/2017    History of present illness Pt is a 61 y/o female s/p elective L TKA. PMH includes anxiety, HTN, fibromyalgia, and breast mass s/p excision.    OT comments  Pt progressing well in ADL and ADL transfers. Educated in home safety and compensatory strategies for ADL. Pt states she is ready to return home with her supportive husband's assist.  Follow Up Recommendations  No OT follow up;Supervision/Assistance - 24 hour    Equipment Recommendations  3 in 1 bedside commode    Recommendations for Other Services      Precautions / Restrictions Precautions Precautions: Knee Restrictions Weight Bearing Restrictions: Yes LLE Weight Bearing: Weight bearing as tolerated       Mobility Bed Mobility Overal bed mobility: Modified Independent             General bed mobility comments: HOB up, use of rail  Transfers Overall transfer level: Modified independent Equipment used: Rolling walker (2 wheeled)             General transfer comment: from bed and toilet, increased time, good technique    Balance     Sitting balance-Leahy Scale: Good       Standing balance-Leahy Scale: Fair Standing balance comment: able to stand statically at sink without UE support and to manage pants                           ADL either performed or assessed with clinical judgement   ADL Overall ADL's : Needs assistance/impaired     Grooming: Supervision/safety;Standing;Wash/dry hands;Wash/dry face;Oral care Grooming Details (indicate cue type and reason): instructed in placement of walker in front of sink     Lower Body Bathing: Minimal assistance Lower Body Bathing Details (indicate cue type and reason): recommended long handled bath sponge Upper Body Dressing : Set up;Sitting   Lower Body Dressing: Minimal assistance;Sit to/from stand Lower Body  Dressing Details (indicate cue type and reason): instructed to dress L LE first and in safe footwear, pt will rely on her husband to assist. Toilet Transfer: RW;Ambulation;Comfort height toilet;Modified Independent   Toileting- Clothing Manipulation and Hygiene: Modified independent;Sit to/from Nurse, children's Details (indicate cue type and reason): educated in technique, simulated Functional mobility during ADLs: Supervision/safety;Rolling walker General ADL Comments: instructed in how to transport items safely with walker     Vision       Perception     Praxis      Cognition Arousal/Alertness: Awake/alert Behavior During Therapy: WFL for tasks assessed/performed Overall Cognitive Status: Within Functional Limits for tasks assessed                                          Exercises     Shoulder Instructions       General Comments      Pertinent Vitals/ Pain       Pain Assessment: Faces Faces Pain Scale: Hurts little more Pain Location: L knee  Pain Descriptors / Indicators: Sore Pain Intervention(s): Monitored during session;Repositioned;Ice applied  Home Living  Prior Functioning/Environment              Frequency  Min 2X/week        Progress Toward Goals  OT Goals(current goals can now be found in the care plan section)  Progress towards OT goals: Progressing toward goals  Acute Rehab OT Goals Patient Stated Goal: to return home Time For Goal Achievement: 09/20/17 Potential to Achieve Goals: Good  Plan Discharge plan remains appropriate    Co-evaluation                 AM-PAC PT "6 Clicks" Daily Activity     Outcome Measure   Help from another person eating meals?: None Help from another person taking care of personal grooming?: None Help from another person toileting, which includes using toliet, bedpan, or urinal?: None Help from another  person bathing (including washing, rinsing, drying)?: A Little Help from another person to put on and taking off regular upper body clothing?: None Help from another person to put on and taking off regular lower body clothing?: A Little 6 Click Score: 22    End of Session Equipment Utilized During Treatment: Rolling walker;Gait belt CPM Left Knee CPM Left Knee: Off  OT Visit Diagnosis: Other abnormalities of gait and mobility (R26.89);Pain Pain - Right/Left: Left Pain - part of body: Knee   Activity Tolerance Patient tolerated treatment well   Patient Left in chair;with call bell/phone within reach;with nursing/sitter in room   Nurse Communication          Time: 5631-4970 OT Time Calculation (min): 38 min  Charges: OT General Charges $OT Visit: 1 Visit OT Treatments $Self Care/Home Management : 38-52 mins  09/08/2017 Nestor Lewandowsky, OTR/L Pager: [phone]   Malka So 09/08/2017, 11:07 AM

## 2017-09-09 DIAGNOSIS — M1991 Primary osteoarthritis, unspecified site: Secondary | ICD-10-CM | POA: Diagnosis not present

## 2017-09-09 DIAGNOSIS — Z471 Aftercare following joint replacement surgery: Secondary | ICD-10-CM | POA: Diagnosis not present

## 2017-09-09 DIAGNOSIS — I10 Essential (primary) hypertension: Secondary | ICD-10-CM | POA: Diagnosis not present

## 2017-09-13 DIAGNOSIS — M1991 Primary osteoarthritis, unspecified site: Secondary | ICD-10-CM | POA: Diagnosis not present

## 2017-09-13 DIAGNOSIS — Z471 Aftercare following joint replacement surgery: Secondary | ICD-10-CM | POA: Diagnosis not present

## 2017-09-13 DIAGNOSIS — I10 Essential (primary) hypertension: Secondary | ICD-10-CM | POA: Diagnosis not present

## 2017-09-15 DIAGNOSIS — I10 Essential (primary) hypertension: Secondary | ICD-10-CM | POA: Diagnosis not present

## 2017-09-15 DIAGNOSIS — M1991 Primary osteoarthritis, unspecified site: Secondary | ICD-10-CM | POA: Diagnosis not present

## 2017-09-15 DIAGNOSIS — Z471 Aftercare following joint replacement surgery: Secondary | ICD-10-CM | POA: Diagnosis not present

## 2017-09-17 DIAGNOSIS — Z471 Aftercare following joint replacement surgery: Secondary | ICD-10-CM | POA: Diagnosis not present

## 2017-09-17 DIAGNOSIS — M1991 Primary osteoarthritis, unspecified site: Secondary | ICD-10-CM | POA: Diagnosis not present

## 2017-09-17 DIAGNOSIS — I10 Essential (primary) hypertension: Secondary | ICD-10-CM | POA: Diagnosis not present

## 2017-09-20 DIAGNOSIS — Z471 Aftercare following joint replacement surgery: Secondary | ICD-10-CM | POA: Diagnosis not present

## 2017-09-20 DIAGNOSIS — M1991 Primary osteoarthritis, unspecified site: Secondary | ICD-10-CM | POA: Diagnosis not present

## 2017-09-20 DIAGNOSIS — I10 Essential (primary) hypertension: Secondary | ICD-10-CM | POA: Diagnosis not present

## 2017-09-21 DIAGNOSIS — M25662 Stiffness of left knee, not elsewhere classified: Secondary | ICD-10-CM | POA: Diagnosis not present

## 2017-09-21 DIAGNOSIS — M25462 Effusion, left knee: Secondary | ICD-10-CM | POA: Diagnosis not present

## 2017-09-21 DIAGNOSIS — M25562 Pain in left knee: Secondary | ICD-10-CM | POA: Diagnosis not present

## 2017-09-23 DIAGNOSIS — M25562 Pain in left knee: Secondary | ICD-10-CM | POA: Diagnosis not present

## 2017-09-23 DIAGNOSIS — M25462 Effusion, left knee: Secondary | ICD-10-CM | POA: Diagnosis not present

## 2017-09-23 DIAGNOSIS — M25662 Stiffness of left knee, not elsewhere classified: Secondary | ICD-10-CM | POA: Diagnosis not present

## 2017-09-28 DIAGNOSIS — M25562 Pain in left knee: Secondary | ICD-10-CM | POA: Diagnosis not present

## 2017-09-28 DIAGNOSIS — M25462 Effusion, left knee: Secondary | ICD-10-CM | POA: Diagnosis not present

## 2017-09-28 DIAGNOSIS — M25662 Stiffness of left knee, not elsewhere classified: Secondary | ICD-10-CM | POA: Diagnosis not present

## 2017-10-05 DIAGNOSIS — M25462 Effusion, left knee: Secondary | ICD-10-CM | POA: Diagnosis not present

## 2017-10-05 DIAGNOSIS — M25562 Pain in left knee: Secondary | ICD-10-CM | POA: Diagnosis not present

## 2017-10-05 DIAGNOSIS — M25662 Stiffness of left knee, not elsewhere classified: Secondary | ICD-10-CM | POA: Diagnosis not present

## 2017-10-07 DIAGNOSIS — M25462 Effusion, left knee: Secondary | ICD-10-CM | POA: Diagnosis not present

## 2017-10-07 DIAGNOSIS — M25662 Stiffness of left knee, not elsewhere classified: Secondary | ICD-10-CM | POA: Diagnosis not present

## 2017-10-07 DIAGNOSIS — M25562 Pain in left knee: Secondary | ICD-10-CM | POA: Diagnosis not present

## 2017-10-12 DIAGNOSIS — M25462 Effusion, left knee: Secondary | ICD-10-CM | POA: Diagnosis not present

## 2017-10-12 DIAGNOSIS — M25662 Stiffness of left knee, not elsewhere classified: Secondary | ICD-10-CM | POA: Diagnosis not present

## 2017-10-12 DIAGNOSIS — M25562 Pain in left knee: Secondary | ICD-10-CM | POA: Diagnosis not present

## 2017-10-14 DIAGNOSIS — M25662 Stiffness of left knee, not elsewhere classified: Secondary | ICD-10-CM | POA: Diagnosis not present

## 2017-10-14 DIAGNOSIS — M25562 Pain in left knee: Secondary | ICD-10-CM | POA: Diagnosis not present

## 2017-10-14 DIAGNOSIS — M25462 Effusion, left knee: Secondary | ICD-10-CM | POA: Diagnosis not present

## 2017-10-26 DIAGNOSIS — M25462 Effusion, left knee: Secondary | ICD-10-CM | POA: Diagnosis not present

## 2017-10-26 DIAGNOSIS — M25562 Pain in left knee: Secondary | ICD-10-CM | POA: Diagnosis not present

## 2017-10-26 DIAGNOSIS — M25662 Stiffness of left knee, not elsewhere classified: Secondary | ICD-10-CM | POA: Diagnosis not present

## 2017-10-28 DIAGNOSIS — M25662 Stiffness of left knee, not elsewhere classified: Secondary | ICD-10-CM | POA: Diagnosis not present

## 2017-10-28 DIAGNOSIS — M25462 Effusion, left knee: Secondary | ICD-10-CM | POA: Diagnosis not present

## 2017-10-28 DIAGNOSIS — M25562 Pain in left knee: Secondary | ICD-10-CM | POA: Diagnosis not present

## 2017-11-04 DIAGNOSIS — M25662 Stiffness of left knee, not elsewhere classified: Secondary | ICD-10-CM | POA: Diagnosis not present

## 2017-11-04 DIAGNOSIS — M25562 Pain in left knee: Secondary | ICD-10-CM | POA: Diagnosis not present

## 2017-11-04 DIAGNOSIS — M25462 Effusion, left knee: Secondary | ICD-10-CM | POA: Diagnosis not present

## 2017-11-08 DIAGNOSIS — M25462 Effusion, left knee: Secondary | ICD-10-CM | POA: Diagnosis not present

## 2017-11-08 DIAGNOSIS — M25562 Pain in left knee: Secondary | ICD-10-CM | POA: Diagnosis not present

## 2017-11-08 DIAGNOSIS — M25662 Stiffness of left knee, not elsewhere classified: Secondary | ICD-10-CM | POA: Diagnosis not present

## 2018-01-27 ENCOUNTER — Other Ambulatory Visit: Payer: Self-pay | Admitting: Internal Medicine

## 2018-01-27 DIAGNOSIS — M5116 Intervertebral disc disorders with radiculopathy, lumbar region: Secondary | ICD-10-CM

## 2018-01-27 DIAGNOSIS — M5010 Cervical disc disorder with radiculopathy, unspecified cervical region: Secondary | ICD-10-CM

## 2018-01-27 DIAGNOSIS — R7989 Other specified abnormal findings of blood chemistry: Secondary | ICD-10-CM | POA: Diagnosis present

## 2018-02-03 ENCOUNTER — Ambulatory Visit
Admission: RE | Admit: 2018-02-03 | Discharge: 2018-02-03 | Disposition: A | Discharge status: Home / Self Care | Payer: Managed Care, Other (non HMO) | Source: Ambulatory Visit | Attending: Internal Medicine | Admitting: Internal Medicine

## 2018-02-03 DIAGNOSIS — M4802 Spinal stenosis, cervical region: Secondary | ICD-10-CM | POA: Diagnosis not present

## 2018-02-03 DIAGNOSIS — M5116 Intervertebral disc disorders with radiculopathy, lumbar region: Secondary | ICD-10-CM | POA: Diagnosis present

## 2018-02-03 DIAGNOSIS — M48061 Spinal stenosis, lumbar region without neurogenic claudication: Secondary | ICD-10-CM | POA: Diagnosis not present

## 2018-02-03 DIAGNOSIS — M5010 Cervical disc disorder with radiculopathy, unspecified cervical region: Secondary | ICD-10-CM

## 2018-03-21 DIAGNOSIS — M170 Bilateral primary osteoarthritis of knee: Secondary | ICD-10-CM | POA: Insufficient documentation

## 2018-04-05 DIAGNOSIS — Z79899 Other long term (current) drug therapy: Secondary | ICD-10-CM | POA: Insufficient documentation

## 2018-04-13 ENCOUNTER — Encounter: Payer: Self-pay | Admitting: Physician Assistant

## 2018-04-13 DIAGNOSIS — M1711 Unilateral primary osteoarthritis, right knee: Secondary | ICD-10-CM

## 2018-04-13 DIAGNOSIS — I10 Essential (primary) hypertension: Secondary | ICD-10-CM

## 2018-04-13 DIAGNOSIS — L409 Psoriasis, unspecified: Secondary | ICD-10-CM | POA: Diagnosis present

## 2018-04-13 DIAGNOSIS — Z96652 Presence of left artificial knee joint: Secondary | ICD-10-CM

## 2018-04-13 HISTORY — DX: Essential (primary) hypertension: I10

## 2018-04-13 HISTORY — DX: Unilateral primary osteoarthritis, right knee: M17.11

## 2018-04-13 HISTORY — DX: Presence of left artificial knee joint: Z96.652

## 2018-04-13 NOTE — H&P (Addendum)
TOTAL KNEE ADMISSION H&P  Patient is being admitted for right total knee arthroplasty.  Subjective:  Chief Complaint:right knee pain.  HPI: Melissa Hunt, 62 y.o. female, has a history of pain and functional disability in the right knee due to arthritis and has failed non-surgical conservative treatments for greater than 12 weeks to includeNSAID's and/or analgesics, corticosteriod injections, viscosupplementation injections, flexibility and strengthening excercises, supervised PT with diminished ADL's post treatment, weight reduction as appropriate and activity modification.  Onset of symptoms was gradual, starting 10 years ago with gradually worsening course since that time. The patient noted prior procedures on the knee to include  arthroscopy and menisectomy on the right knee(s).  Patient currently rates pain in the right knee(s) at 10 out of 10 with activity. Patient has night pain, worsening of pain with activity and weight bearing, pain that interferes with activities of daily living, crepitus and joint swelling.  Patient has evidence of subchondral sclerosis, periarticular osteophytes and joint space narrowing by imaging studies. There is no active infection.  Patient Active Problem List   Diagnosis Date Noted  . Primary localized osteoarthritis of right knee 04/13/2018  . S/P total knee arthroplasty, left 04/13/2018  . Essential hypertension 04/13/2018  . Psoriasis, unspecified 04/13/2018  . Low serum vitamin D 01/27/2018  . Primary localized osteoarthritis of left knee 09/06/2017  . Nipple discharge 08/11/2017  . Pseudoangiomatous stromal hyperplasia of breast 08/11/2017  . Hyperlipidemia, mixed 07/22/2016  . Acquired hypothyroidism 07/22/2015  . Atypical ductal hyperplasia of breast 01/11/2014   Past Medical History:  Diagnosis Date  . Anxiety   . Arthritis 2008  . Breast discharge 2 weeks ago   LEFT BREAST BLOODY  . Essential hypertension 04/13/2018  . Fibromyalgia   .  Hypertension 2000  . Hypothyroidism   . Lump or mass in breast   . Neoplasm of uncertain behavior of breast   . Neuromuscular disorder ([place])    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  . Primary localized osteoarthritis of left knee 09/06/2017  . Primary localized osteoarthritis of right knee 04/13/2018  . S/P total knee arthroplasty, left 04/13/2018    Past Surgical History:  Procedure Laterality Date  . BREAST BIOPSY Left 2014   neg, but took Tamoxifen 1 1/2 yrs  . BREAST BIOPSY Right    many years ago  . BREAST BIOPSY Left 03/12/2017   FIBROCYSTIC CHANGES WITH CALCIFICATIONS.[place]  . BREAST SURGERY Left 2014   breast biopsy/Atypical ductal hyperplasia of breast   . BREAST SURGERY Right 1999   excision of mass  . CESAREAN SECTION  1985  . CHOLECYSTECTOMY  1994  . COLONOSCOPY  2013   Dr Candace Cruise  . ENDOMETRIAL ABLATION  2006  . TOTAL KNEE ARTHROPLASTY Left 09/06/2017   Procedure: TOTAL KNEE ARTHROPLASTY;  Surgeon: Elsie Saas, MD;  Location: [facility];  Service: Orthopedics;  Laterality: Left;    No current facility-administered medications for this encounter.    Current Outpatient Medications  Medication Sig Dispense Refill Last Dose  . Cholecalciferol (VITAMIN D3) 2000 UNITS TABS Take 1 capsule by mouth daily.   04/13/2018 at Unknown time  . clobetasol cream (TEMOVATE) 4.09 % Apply 1 application topically 2 (two) times daily as needed.   04/13/2018 at Unknown time  . FLUoxetine (PROZAC) 40 MG capsule Take 40 mg by mouth daily.    04/13/2018 at Unknown time  . hydrochlorothiazide (MICROZIDE) 12.5 MG capsule Take 12.5 mg by mouth daily as needed.   04/13/2018 at Unknown time  . levothyroxine (  SYNTHROID, LEVOTHROID) 150 MCG tablet Take 150 mcg by mouth daily before breakfast.    04/13/2018 at Unknown time  . MAGNESIUM CITRATE PO Take 1 capsule by mouth daily.   04/13/2018 at Unknown time  . Multiple Vitamins-Minerals (MULTIVITAMIN WITH MINERALS) tablet Take 1 tablet by mouth daily.   04/13/2018 at Unknown time  .  olmesartan (BENICAR) 20 MG tablet Take 10 mg by mouth daily.    04/13/2018 at Unknown time  . tiZANidine (ZANAFLEX) 4 MG tablet TAKE 1/2 TO 1 TABLET BY MOUTH TWICE A DAY AS NEEDED FOR SPASM (MAY CAUSE DROWSINESS) (MAIL REQ)  1 04/13/2018 at Unknown time  . traMADol (ULTRAM) 50 MG tablet Take by mouth every 6 (six) hours as needed (patient takes between 2 and 5 pills a day).     Marland Kitchen acetaminophen (TYLENOL) 325 MG tablet Take 2 tablets (650 mg total) by mouth every 4 (four) hours as needed for mild pain ((score 1 to 3) or temp > 100.5).   More than a month at Unknown time  . apixaban (ELIQUIS) 2.5 MG TABS tablet Take 1 tablet (2.5 mg total) by mouth every 12 (twelve) hours. 60 tablet 0 More than a month at Unknown time  . docusate sodium (COLACE) 100 MG capsule 1 tab 2 times a day while on narcotics.  STOOL SOFTENER 60 capsule 0 More than a month at Unknown time  . oxyCODONE (OXY IR/ROXICODONE) 5 MG immediate release tablet 1-2 tablets every 4-6 hrs as needed for pain 60 tablet 0 More than a month at Unknown time  . polyethylene glycol (MIRALAX / GLYCOLAX) packet 17grams in 6 oz of water twice a day until bowel movement.  LAXITIVE.  Restart if two days since last bowel movement 14 each 0 More than a month at Unknown time   No Known Allergies  Social History   Tobacco Use  . Smoking status: Never Smoker  . Smokeless tobacco: Never Used  Substance Use Topics  . Alcohol use: Yes    Alcohol/week: 0.6 oz    Types: 1 Glasses of wine per week    Family History  Problem Relation Age of Onset  . Cancer Father 22       colon  . Cancer Paternal Aunt        liver  . Cancer Maternal Grandmother        lung  . Breast cancer Maternal Aunt   . Breast cancer Paternal Aunt      Review of Systems  Constitutional: Negative.   HENT: Negative.   Eyes: Negative.   Respiratory: Negative.   Cardiovascular: Negative.   Gastrointestinal: Negative.   Genitourinary: Negative.   Musculoskeletal: Positive for back  pain and joint pain.  Skin: Negative.   Neurological: Positive for dizziness.  Endo/Heme/Allergies: Negative.   Psychiatric/Behavioral: Negative.     Objective:  Physical Exam  Constitutional: She is oriented to person, place, and time. She appears well-developed and well-nourished.  HENT:  Head: Normocephalic and atraumatic.  Mouth/Throat: Oropharynx is clear and moist.  Eyes: Pupils are equal, round, and reactive to light. Conjunctivae and EOM are normal.  Neck: Neck supple.  Cardiovascular: Normal rate, regular rhythm, normal heart sounds and intact distal pulses.  Respiratory: Effort normal and breath sounds normal.  GI: Soft. Bowel sounds are normal.  Genitourinary:  Genitourinary Comments: Not pertinent to current symptomatology therefore not examined.  Musculoskeletal:  Examination of her right knee reveals full range of motion with pain medially.  2+ crepitus 1+ synovitis.  Range of motion -5-110 degrees.  Knee is stable with normal patella tracking.Examination of her left knee reveals that the incision is well healed.  Minimal swelling.  Minimal pain.  Range of motion 0-120 degrees.  Knee is stable.        Neurological: She is alert and oriented to person, place, and time.  Skin: Skin is warm and dry.  Psychiatric: She has a normal mood and affect. Her behavior is normal.    Vital signs in last 24 hours: Temp:  [98.1 F (36.7 C)] 98.1 F (36.7 C) (06/05 1500) Pulse Rate:  [88] 88 (06/05 1500) BP: (118)/(81) 118/81 (06/05 1500) SpO2:  [98 %] 98 % (06/05 1500) Weight:  [101.2 kg (223 lb)] 101.2 kg (223 lb) (06/05 1500)  Labs:   Estimated body mass index is 38.28 kg/m as calculated from the following:   Height as of this encounter: 5\' 4"  (1.626 m).   Weight as of this encounter: 101.2 kg (223 lb).   Imaging Review Plain radiographs demonstrate severe degenerative joint disease of the right knee(s). The overall alignment issignificant varus. The bone quality  appears to be good for age and reported activity level.   Preoperative templating of the joint replacement has been completed, documented, and submitted to the Operating Room personnel in order to optimize intra-operative equipment management.   Anticipated LOS equal to or greater than 2 midnights due to - Age 67 and older with one or more of the following:  - Obesity  - Expected need for hospital services (PT, OT, Nursing) required for safe  discharge  Patient was slow to mobilize after 1st total knee and required two nights of hospitalization  - Active co-morbidities: breast cancer, psoriatic arthritis and hypertension OR        Assessment/Plan:  End stage arthritis, right knee  Principal Problem:   Primary localized osteoarthritis of right knee Active Problems:   Atypical ductal hyperplasia of breast   Pseudoangiomatous stromal hyperplasia of breast   S/P total knee arthroplasty, left   Essential hypertension   Low serum vitamin D   Hyperlipidemia, mixed   Psoriasis, unspecified   Acquired hypothyroidism    The patient history, physical examination, clinical judgment of the provider and imaging studies are consistent with end stage degenerative joint disease of the right knee(s) and total knee arthroplasty is deemed medically necessary. The treatment options including medical management, injection therapy arthroscopy and arthroplasty were discussed at length. The risks and benefits of total knee arthroplasty were presented and reviewed. The risks due to aseptic loosening, infection, stiffness, patella tracking problems, thromboembolic complications and other imponderables were discussed. The patient acknowledged the explanation, agreed to proceed with the plan and consent was signed. Patient is being admitted for inpatient treatment for surgery, pain control, PT, OT, prophylactic antibiotics, VTE prophylaxis, progressive ambulation and ADL's and discharge planning. The patient  is planning to be discharged home with home health services   Inpatient stay prior authorized by [facility]

## 2018-04-21 ENCOUNTER — Encounter (HOSPITAL_COMMUNITY): Payer: Self-pay

## 2018-04-21 ENCOUNTER — Other Ambulatory Visit: Payer: Self-pay

## 2018-04-21 ENCOUNTER — Encounter (HOSPITAL_COMMUNITY)
Admission: RE | Admit: 2018-04-21 | Discharge: 2018-04-21 | Disposition: A | Discharge status: Home / Self Care | Payer: Managed Care, Other (non HMO) | Source: Ambulatory Visit | Attending: Orthopedic Surgery | Admitting: Orthopedic Surgery

## 2018-04-21 DIAGNOSIS — Z01818 Encounter for other preprocedural examination: Secondary | ICD-10-CM | POA: Insufficient documentation

## 2018-04-21 DIAGNOSIS — M1711 Unilateral primary osteoarthritis, right knee: Secondary | ICD-10-CM | POA: Diagnosis not present

## 2018-04-21 HISTORY — DX: Unilateral primary osteoarthritis, unspecified knee: M17.10

## 2018-04-21 LAB — COMPREHENSIVE METABOLIC PANEL
ALBUMIN: 3.8 g/dL (ref 3.5–5.0)
ALT: 16 U/L (ref 14–54)
AST: 19 U/L (ref 15–41)
Alkaline Phosphatase: 62 U/L (ref 38–126)
Anion gap: 9 (ref 5–15)
BILIRUBIN TOTAL: 0.3 mg/dL (ref 0.3–1.2)
BUN: 17 mg/dL (ref 6–20)
CO2: 28 mmol/L (ref 22–32)
CREATININE: 0.72 mg/dL (ref 0.44–1.00)
Calcium: 9.4 mg/dL (ref 8.9–10.3)
Chloride: 104 mmol/L (ref 101–111)
GFR calc Af Amer: >60 mL/min (ref >60)
GLUCOSE: 131 mg/dL — AB (ref 65–99)
POTASSIUM: 3.7 mmol/L (ref 3.5–5.1)
Sodium: 141 mmol/L (ref 135–145)
Total Protein: 6.3 g/dL — ABNORMAL LOW (ref 6.5–8.1)

## 2018-04-21 LAB — CBC WITH DIFFERENTIAL/PLATELET
ABS IMMATURE GRANULOCYTES: 0 10*3/uL (ref 0.0–0.1)
Basophils Absolute: 0 10*3/uL (ref 0.0–0.1)
Basophils Relative: 1 %
Eosinophils Absolute: 0.2 10*3/uL (ref 0.0–0.7)
Eosinophils Relative: 2 %
HEMATOCRIT: 41.4 % (ref 36.0–46.0)
HEMOGLOBIN: 13.6 g/dL (ref 12.0–15.0)
Immature Granulocytes: 0 %
LYMPHS PCT: 27 %
Lymphs Abs: 1.7 10*3/uL (ref 0.7–4.0)
MCH: 30.7 pg (ref 26.0–34.0)
MCHC: 32.9 g/dL (ref 30.0–36.0)
MCV: 93.5 fL (ref 78.0–100.0)
MONOS PCT: 10 %
Monocytes Absolute: 0.6 10*3/uL (ref 0.1–1.0)
NEUTROS ABS: 3.8 10*3/uL (ref 1.7–7.7)
NEUTROS PCT: 60 %
Platelets: 218 10*3/uL (ref 150–400)
RBC: 4.43 MIL/uL (ref 3.87–5.11)
RDW: 12.4 % (ref 11.5–15.5)
WBC: 6.3 10*3/uL (ref 4.0–10.5)

## 2018-04-21 LAB — TYPE AND SCREEN
ABO/RH(D): A NEG
ANTIBODY SCREEN: NEGATIVE

## 2018-04-21 LAB — PROTIME-INR
INR: 0.94
Prothrombin Time: 12.5 seconds (ref 11.4–15.2)

## 2018-04-21 LAB — SURGICAL PCR SCREEN
MRSA, PCR: NEGATIVE
STAPHYLOCOCCUS AUREUS: NEGATIVE

## 2018-04-21 LAB — APTT: aPTT: 28 seconds (ref 24–36)

## 2018-04-21 NOTE — Pre-Procedure Instructions (Signed)
Kasondra Junod [facility]  04/21/2018      [id number] [address], [place] [address] Phone: [phone] Fax: [place], [place] [facility] [address] [place] [address] [place] [address] Phone: [phone] Fax: [phone]    Your procedure is scheduled on Mon., May 02, 2018 from 7:15AM-9:27AM  Report to [facility] Admitting Entrance "A" at 5:30AM  Call this number if you have problems the morning of surgery:  [phone]   Remember:  Do not eat or drink after midnight on June 23rd   Take these medicines the morning of surgery with A SIP OF WATER By 4:30AM: FLUoxetine (PROZAC) and Levothyroxine (SYNTHROID, LEVOTHROID). If needed  TraMADol (ULTRAM) and TiZANidine (ZANAFLEX)    7 days before surgery (6/17), stop taking all Other Aspirin Products, Vitamins, Fish oils, and Herbal medications. Also stop all NSAIDS i.e. Advil, Ibuprofen, Motrin, Aleve, Anaprox, Naproxen, BC, Goody Powders, and all Supplements. Including: meloxicam (MOBIC)    Do not wear jewelry, make-up or nail polish.  Do not wear lotions, powders, or perfumes, or deodorant.  Do not shave 48 hours prior to surgery.    Do not bring valuables to the hospital.  [facility] is not responsible for any belongings or valuables.  Contacts, dentures or bridgework may not be worn into surgery.  Leave your suitcase in the car.  After surgery it may be brought to your room.  For patients admitted to the hospital, discharge time will be determined by your treatment team.  Patients discharged the day of surgery will not be allowed to drive home.   Special instructions:   [facility]- Preparing For Surgery  Before surgery, you can play an important role. Because skin is not sterile, your skin needs to be as free of germs as possible. You can reduce the number of germs on your skin by washing with CHG  (chlorahexidine gluconate) Soap before surgery.  CHG is an antiseptic cleaner which kills germs and bonds with the skin to continue killing germs even after washing.    Oral Hygiene is also important to reduce your risk of infection.  Remember - BRUSH YOUR TEETH THE MORNING OF SURGERY WITH YOUR REGULAR TOOTHPASTE  Please do not use if you have an allergy to CHG or antibacterial soaps. If your skin becomes reddened/irritated stop using the CHG.  Do not shave (including legs and underarms) for at least 48 hours prior to first CHG shower. It is OK to shave your face.  Please follow these instructions carefully.   1. Shower the NIGHT BEFORE SURGERY and the MORNING OF SURGERY with CHG.   2. If you chose to wash your hair, wash your hair first as usual with your normal shampoo.  3. After you shampoo, rinse your hair and body thoroughly to remove the shampoo.  4. Use CHG as you would any other liquid soap. You can apply CHG directly to the skin and wash gently with a scrungie or a clean washcloth.   5. Apply the CHG Soap to your body ONLY FROM THE NECK DOWN.  Do not use on open wounds or open sores. Avoid contact with your eyes, ears, mouth and genitals (private parts). Wash Face and genitals (private parts)  with your normal soap.  6. Wash thoroughly, paying special attention to the area where your surgery will be performed.  7. Thoroughly rinse your body with  warm water from the neck down.  8. DO NOT shower/wash with your normal soap after using and rinsing off the CHG Soap.  9. Pat yourself dry with a CLEAN TOWEL.  10. Wear CLEAN PAJAMAS to bed the night before surgery, wear comfortable clothes the morning of surgery  11. Place CLEAN SHEETS on your bed the night of your first shower and DO NOT SLEEP WITH PETS.  Day of Surgery:  Do not apply any deodorants/lotions.  Please wear clean clothes to the hospital/surgery center.   Remember to brush your teeth WITH YOUR REGULAR  TOOTHPASTE.  Please read over the following fact sheets that you were given. Pain Booklet, Coughing and Deep Breathing, MRSA Information and Surgical Site Infection Prevention

## 2018-04-21 NOTE — Progress Notes (Signed)
[facility]  Cardiologist - Denies  Chest x-ray - Denies  EKG - 04/12/18 In Chart  Stress Test - Denies  ECHO - Denies  Cardiac Cath - Denies  Sleep Study - Denies CPAP - None  LABS- 04/21/18: CBC w/D, CMP, PT, PTT, T/S, UC  ASA- Denies  Anesthesia- No  Pt denies having chest pain, sob, or fever at this time. All instructions explained to the pt, with a verbal understanding of the material. Pt agrees to go over the instructions while at home for a better understanding. The opportunity to ask questions was provided.

## 2018-04-22 LAB — URINE CULTURE: Culture: <10000 — AB

## 2018-04-29 MED ORDER — BUPIVACAINE LIPOSOME 1.3 % IJ SUSP
20.0000 mL | Freq: Once | INTRAMUSCULAR | Status: DC
  Filled 2018-04-29: qty 20

## 2018-04-29 MED ORDER — TRANEXAMIC ACID 1000 MG/10ML IV SOLN
1000.0000 mg | INTRAVENOUS | Status: AC
  Administered 2018-05-02: 1000 mg via INTRAVENOUS
  Filled 2018-04-29: qty 1100

## 2018-05-01 NOTE — Anesthesia Preprocedure Evaluation (Addendum)
Anesthesia Evaluation  Patient identified by MRN, date of birth, ID band Patient awake    Reviewed: Allergy & Precautions, H&P , NPO status , Patient's Chart, lab work & pertinent test results  Airway Mallampati: II  TM Distance: >3 FB Neck ROM: Full    Dental  (+) Teeth Intact   Pulmonary neg pulmonary ROS,    Pulmonary exam normal breath sounds clear to auscultation       Cardiovascular hypertension, Pt. on medications Normal cardiovascular exam Rhythm:Regular Rate:Normal     Neuro/Psych    GI/Hepatic negative GI ROS, Neg liver ROS,   Endo/Other  negative endocrine ROS  Renal/GU negative Renal ROS     Musculoskeletal   Abdominal (+) + obese,   Peds  Hematology negative hematology ROS (+)   Anesthesia Other Findings   Reproductive/Obstetrics                            Anesthesia Physical  Anesthesia Plan  ASA: II  Anesthesia Plan: Spinal   Post-op Pain Management:  Regional for Post-op pain   Induction:   PONV Risk Score and Plan: 2 and Ondansetron, Dexamethasone and Treatment may vary due to age or medical condition  Airway Management Planned: Natural Airway and Simple Face Mask  Additional Equipment:   Intra-op Plan:   Post-operative Plan:   Informed Consent: I have reviewed the patients History and Physical, chart, labs and discussed the procedure including the risks, benefits and alternatives for the proposed anesthesia with the patient or authorized representative who has indicated his/her understanding and acceptance.   Dental Advisory Given  Plan Discussed with: CRNA and Surgeon  Anesthesia Plan Comments: (  )       Anesthesia Quick Evaluation

## 2018-05-02 ENCOUNTER — Inpatient Hospital Stay (HOSPITAL_COMMUNITY): Payer: Managed Care, Other (non HMO) | Admitting: Anesthesiology

## 2018-05-02 ENCOUNTER — Encounter (HOSPITAL_COMMUNITY): Payer: Self-pay

## 2018-05-02 ENCOUNTER — Other Ambulatory Visit: Payer: Self-pay

## 2018-05-02 ENCOUNTER — Encounter (HOSPITAL_COMMUNITY)
Admission: RE | Disposition: A | Discharge status: Home / Self Care | Payer: Self-pay | Source: Ambulatory Visit | Attending: Orthopedic Surgery

## 2018-05-02 ENCOUNTER — Observation Stay (HOSPITAL_COMMUNITY)
Admission: RE | Admit: 2018-05-02 | Discharge: 2018-05-03 | Disposition: A | Discharge status: Home / Self Care | Payer: Managed Care, Other (non HMO) | Source: Ambulatory Visit | Attending: Orthopedic Surgery | Admitting: Orthopedic Surgery

## 2018-05-02 DIAGNOSIS — Z79890 Hormone replacement therapy (postmenopausal): Secondary | ICD-10-CM | POA: Diagnosis not present

## 2018-05-02 DIAGNOSIS — Z7901 Long term (current) use of anticoagulants: Secondary | ICD-10-CM | POA: Diagnosis not present

## 2018-05-02 DIAGNOSIS — Z6838 Body mass index (BMI) 38.0-38.9, adult: Secondary | ICD-10-CM | POA: Insufficient documentation

## 2018-05-02 DIAGNOSIS — E039 Hypothyroidism, unspecified: Secondary | ICD-10-CM | POA: Diagnosis not present

## 2018-05-02 DIAGNOSIS — Z79899 Other long term (current) drug therapy: Secondary | ICD-10-CM | POA: Insufficient documentation

## 2018-05-02 DIAGNOSIS — I10 Essential (primary) hypertension: Secondary | ICD-10-CM | POA: Diagnosis not present

## 2018-05-02 DIAGNOSIS — Z96652 Presence of left artificial knee joint: Secondary | ICD-10-CM | POA: Diagnosis not present

## 2018-05-02 DIAGNOSIS — E782 Mixed hyperlipidemia: Secondary | ICD-10-CM | POA: Diagnosis not present

## 2018-05-02 DIAGNOSIS — R7989 Other specified abnormal findings of blood chemistry: Secondary | ICD-10-CM | POA: Diagnosis present

## 2018-05-02 DIAGNOSIS — E669 Obesity, unspecified: Secondary | ICD-10-CM | POA: Insufficient documentation

## 2018-05-02 DIAGNOSIS — N6489 Other specified disorders of breast: Secondary | ICD-10-CM | POA: Diagnosis present

## 2018-05-02 DIAGNOSIS — M1711 Unilateral primary osteoarthritis, right knee: Secondary | ICD-10-CM | POA: Diagnosis not present

## 2018-05-02 DIAGNOSIS — L409 Psoriasis, unspecified: Secondary | ICD-10-CM | POA: Diagnosis present

## 2018-05-02 DIAGNOSIS — M25761 Osteophyte, right knee: Secondary | ICD-10-CM | POA: Insufficient documentation

## 2018-05-02 DIAGNOSIS — N6099 Unspecified benign mammary dysplasia of unspecified breast: Secondary | ICD-10-CM | POA: Diagnosis present

## 2018-05-02 DIAGNOSIS — M25561 Pain in right knee: Secondary | ICD-10-CM | POA: Diagnosis present

## 2018-05-02 HISTORY — DX: Presence of left artificial knee joint: Z96.652

## 2018-05-02 HISTORY — DX: Essential (primary) hypertension: I10

## 2018-05-02 HISTORY — PX: TOTAL KNEE ARTHROPLASTY: SHX125

## 2018-05-02 HISTORY — DX: Unilateral primary osteoarthritis, right knee: M17.11

## 2018-05-02 SURGERY — ARTHROPLASTY, KNEE, TOTAL
Anesthesia: Spinal | Laterality: Right

## 2018-05-02 MED ORDER — METOCLOPRAMIDE HCL 5 MG PO TABS: 5.0000 mg | ORAL_TABLET | Freq: Three times a day (TID) | ORAL | Status: DC | PRN

## 2018-05-02 MED ORDER — SODIUM CHLORIDE 0.9 % IJ SOLN
INTRAMUSCULAR | Status: DC | PRN
  Administered 2018-05-02: 50 mL via INTRAVENOUS

## 2018-05-02 MED ORDER — BUPIVACAINE-EPINEPHRINE (PF) 0.5% -1:200000 IJ SOLN
INTRAMUSCULAR | Status: AC
  Filled 2018-05-02: qty 60

## 2018-05-02 MED ORDER — MENTHOL 3 MG MT LOZG: 1.0000 | LOZENGE | OROMUCOSAL | Status: DC | PRN

## 2018-05-02 MED ORDER — POTASSIUM CHLORIDE IN NACL 20-0.9 MEQ/L-% IV SOLN
INTRAVENOUS | Status: DC
  Administered 2018-05-02: 12:00:00 via INTRAVENOUS
  Filled 2018-05-02: qty 1000

## 2018-05-02 MED ORDER — CEFAZOLIN SODIUM-DEXTROSE 2-4 GM/100ML-% IV SOLN
2.0000 g | Freq: Four times a day (QID) | INTRAVENOUS | Status: AC
  Administered 2018-05-02 (×2): 2 g via INTRAVENOUS
  Filled 2018-05-02 (×3): qty 100

## 2018-05-02 MED ORDER — GABAPENTIN 300 MG PO CAPS
300.0000 mg | ORAL_CAPSULE | Freq: Every day | ORAL | Status: DC
  Administered 2018-05-02: 300 mg via ORAL
  Filled 2018-05-02: qty 1

## 2018-05-02 MED ORDER — HYDROMORPHONE HCL 1 MG/ML IJ SOLN
0.2500 mg | INTRAMUSCULAR | Status: DC | PRN
  Administered 2018-05-02 (×4): 0.5 mg via INTRAVENOUS

## 2018-05-02 MED ORDER — ROPIVACAINE HCL 7.5 MG/ML IJ SOLN
INTRAMUSCULAR | Status: DC | PRN
  Administered 2018-05-02 (×4): 5 mL via PERINEURAL

## 2018-05-02 MED ORDER — PROPOFOL 10 MG/ML IV BOLUS
INTRAVENOUS | Status: DC | PRN
  Administered 2018-05-02 (×2): 20 mg via INTRAVENOUS

## 2018-05-02 MED ORDER — BUPIVACAINE-EPINEPHRINE 0.25% -1:200000 IJ SOLN
INTRAMUSCULAR | Status: AC
  Filled 2018-05-02: qty 1

## 2018-05-02 MED ORDER — DOCUSATE SODIUM 100 MG PO CAPS
100.0000 mg | ORAL_CAPSULE | Freq: Two times a day (BID) | ORAL | Status: DC
  Administered 2018-05-02 – 2018-05-03 (×3): 100 mg via ORAL
  Filled 2018-05-02 (×3): qty 1

## 2018-05-02 MED ORDER — LACTATED RINGERS IV SOLN
INTRAVENOUS | Status: DC
  Administered 2018-05-02 (×2): via INTRAVENOUS

## 2018-05-02 MED ORDER — BUPIVACAINE-EPINEPHRINE 0.5% -1:200000 IJ SOLN
INTRAMUSCULAR | Status: DC | PRN
  Administered 2018-05-02: 50 mL

## 2018-05-02 MED ORDER — TIZANIDINE HCL 2 MG PO TABS
2.0000 mg | ORAL_TABLET | Freq: Two times a day (BID) | ORAL | Status: DC | PRN
  Administered 2018-05-02: 4 mg via ORAL
  Filled 2018-05-02 (×2): qty 2

## 2018-05-02 MED ORDER — KETOROLAC TROMETHAMINE 30 MG/ML IJ SOLN
INTRAMUSCULAR | Status: AC
  Administered 2018-05-02: 30 mg via INTRAVENOUS
  Filled 2018-05-02: qty 1

## 2018-05-02 MED ORDER — CEFAZOLIN SODIUM-DEXTROSE 2-4 GM/100ML-% IV SOLN
2.0000 g | INTRAVENOUS | Status: AC
  Administered 2018-05-02: 2 g via INTRAVENOUS
  Filled 2018-05-02: qty 100

## 2018-05-02 MED ORDER — LEVOTHYROXINE SODIUM 150 MCG PO TABS
150.0000 ug | ORAL_TABLET | Freq: Every day | ORAL | Status: DC
  Administered 2018-05-03: 150 ug via ORAL
  Filled 2018-05-02: qty 2
  Filled 2018-05-02: qty 1

## 2018-05-02 MED ORDER — ASPIRIN EC 325 MG PO TBEC
325.0000 mg | DELAYED_RELEASE_TABLET | Freq: Every day | ORAL | Status: DC
  Administered 2018-05-03: 325 mg via ORAL
  Filled 2018-05-02: qty 1

## 2018-05-02 MED ORDER — PROPOFOL 10 MG/ML IV BOLUS
INTRAVENOUS | Status: AC
  Filled 2018-05-02: qty 20

## 2018-05-02 MED ORDER — OXYCODONE HCL 5 MG PO TABS
5.0000 mg | ORAL_TABLET | ORAL | Status: DC | PRN
  Administered 2018-05-02: 5 mg via ORAL
  Administered 2018-05-02 – 2018-05-03 (×4): 10 mg via ORAL
  Filled 2018-05-02 (×2): qty 2
  Filled 2018-05-02: qty 1
  Filled 2018-05-02: qty 2

## 2018-05-02 MED ORDER — ONDANSETRON HCL 4 MG/2ML IJ SOLN: 4.0000 mg | Freq: Four times a day (QID) | INTRAMUSCULAR | Status: DC | PRN

## 2018-05-02 MED ORDER — FENTANYL CITRATE (PF) 250 MCG/5ML IJ SOLN
INTRAMUSCULAR | Status: AC
  Filled 2018-05-02: qty 5

## 2018-05-02 MED ORDER — 0.9 % SODIUM CHLORIDE (POUR BTL) OPTIME
TOPICAL | Status: DC | PRN
  Administered 2018-05-02: 1000 mL

## 2018-05-02 MED ORDER — FENTANYL CITRATE (PF) 250 MCG/5ML IJ SOLN
INTRAMUSCULAR | Status: DC | PRN
  Administered 2018-05-02 (×3): 50 ug via INTRAVENOUS
  Administered 2018-05-02: 75 ug via INTRAVENOUS
  Administered 2018-05-02: 25 ug via INTRAVENOUS

## 2018-05-02 MED ORDER — ACETAMINOPHEN 500 MG PO TABS
1000.0000 mg | ORAL_TABLET | Freq: Four times a day (QID) | ORAL | Status: AC
  Administered 2018-05-02 – 2018-05-03 (×4): 1000 mg via ORAL
  Filled 2018-05-02 (×4): qty 2

## 2018-05-02 MED ORDER — CHLORHEXIDINE GLUCONATE 4 % EX LIQD: 60.0000 mL | Freq: Once | CUTANEOUS | Status: DC

## 2018-05-02 MED ORDER — PROPOFOL 500 MG/50ML IV EMUL
INTRAVENOUS | Status: DC | PRN
  Administered 2018-05-02: 09:00:00 via INTRAVENOUS
  Administered 2018-05-02: 100 ug/kg/min via INTRAVENOUS

## 2018-05-02 MED ORDER — POLYETHYLENE GLYCOL 3350 17 G PO PACK
17.0000 g | PACK | Freq: Two times a day (BID) | ORAL | Status: DC
  Administered 2018-05-02 – 2018-05-03 (×3): 17 g via ORAL
  Filled 2018-05-02 (×3): qty 1

## 2018-05-02 MED ORDER — ALUM & MAG HYDROXIDE-SIMETH 200-200-20 MG/5ML PO SUSP: 30.0000 mL | ORAL | Status: DC | PRN

## 2018-05-02 MED ORDER — ROPIVACAINE HCL 5 MG/ML IJ SOLN
INTRAMUSCULAR | Status: DC | PRN
  Administered 2018-05-02 (×2): 5 mL via PERINEURAL

## 2018-05-02 MED ORDER — PROMETHAZINE HCL 25 MG/ML IJ SOLN: 6.2500 mg | INTRAMUSCULAR | Status: DC | PRN

## 2018-05-02 MED ORDER — HYDROMORPHONE HCL 1 MG/ML IJ SOLN
INTRAMUSCULAR | Status: AC
  Administered 2018-05-02: 0.5 mg via INTRAVENOUS
  Filled 2018-05-02: qty 1

## 2018-05-02 MED ORDER — MIDAZOLAM HCL 5 MG/5ML IJ SOLN
INTRAMUSCULAR | Status: DC | PRN
  Administered 2018-05-02: 2 mg via INTRAVENOUS

## 2018-05-02 MED ORDER — PHENYLEPHRINE HCL 10 MG/ML IJ SOLN
INTRAVENOUS | Status: DC | PRN
  Administered 2018-05-02: 10 ug/min via INTRAVENOUS

## 2018-05-02 MED ORDER — POVIDONE-IODINE 7.5 % EX SOLN: Freq: Once | CUTANEOUS | Status: DC

## 2018-05-02 MED ORDER — BUPIVACAINE IN DEXTROSE 0.75-8.25 % IT SOLN
INTRATHECAL | Status: DC | PRN
  Administered 2018-05-02: 1.6 mL via INTRATHECAL

## 2018-05-02 MED ORDER — HYDROMORPHONE HCL 2 MG/ML IJ SOLN
0.5000 mg | INTRAMUSCULAR | Status: DC | PRN
Start: 2018-05-02 — End: 2018-05-03
  Administered 2018-05-03 (×2): 1 mg via INTRAVENOUS
  Filled 2018-05-02 (×2): qty 1

## 2018-05-02 MED ORDER — FLUOXETINE HCL 20 MG PO CAPS
40.0000 mg | ORAL_CAPSULE | Freq: Every day | ORAL | Status: DC
  Administered 2018-05-03: 40 mg via ORAL
  Filled 2018-05-02: qty 2

## 2018-05-02 MED ORDER — MAGNESIUM OXIDE 400 (241.3 MG) MG PO TABS
400.0000 mg | ORAL_TABLET | Freq: Every day | ORAL | Status: DC
  Administered 2018-05-02 – 2018-05-03 (×2): 400 mg via ORAL
  Filled 2018-05-02 (×2): qty 1

## 2018-05-02 MED ORDER — DEXAMETHASONE SODIUM PHOSPHATE 10 MG/ML IJ SOLN
INTRAMUSCULAR | Status: AC
  Filled 2018-05-02: qty 1

## 2018-05-02 MED ORDER — LIDOCAINE 2% (20 MG/ML) 5 ML SYRINGE
INTRAMUSCULAR | Status: AC
  Filled 2018-05-02: qty 5

## 2018-05-02 MED ORDER — DEXAMETHASONE SODIUM PHOSPHATE 10 MG/ML IJ SOLN
10.0000 mg | Freq: Three times a day (TID) | INTRAMUSCULAR | Status: DC
  Administered 2018-05-02 – 2018-05-03 (×3): 10 mg via INTRAVENOUS
  Filled 2018-05-02 (×3): qty 1

## 2018-05-02 MED ORDER — PHENYLEPHRINE 40 MCG/ML (10ML) SYRINGE FOR IV PUSH (FOR BLOOD PRESSURE SUPPORT)
PREFILLED_SYRINGE | INTRAVENOUS | Status: AC
  Filled 2018-05-02: qty 10

## 2018-05-02 MED ORDER — KETOROLAC TROMETHAMINE 30 MG/ML IJ SOLN
30.0000 mg | Freq: Once | INTRAMUSCULAR | Status: AC | PRN
  Administered 2018-05-02: 30 mg via INTRAVENOUS

## 2018-05-02 MED ORDER — DEXAMETHASONE SODIUM PHOSPHATE 10 MG/ML IJ SOLN
INTRAMUSCULAR | Status: DC | PRN
  Administered 2018-05-02: 10 mg via INTRAVENOUS

## 2018-05-02 MED ORDER — ONDANSETRON HCL 4 MG PO TABS: 4.0000 mg | ORAL_TABLET | Freq: Four times a day (QID) | ORAL | Status: DC | PRN

## 2018-05-02 MED ORDER — MEPERIDINE HCL 50 MG/ML IJ SOLN: 6.2500 mg | INTRAMUSCULAR | Status: DC | PRN

## 2018-05-02 MED ORDER — BUPIVACAINE LIPOSOME 1.3 % IJ SUSP
INTRAMUSCULAR | Status: DC | PRN
  Administered 2018-05-02: 20 mL

## 2018-05-02 MED ORDER — ONDANSETRON HCL 4 MG/2ML IJ SOLN
INTRAMUSCULAR | Status: DC | PRN
  Administered 2018-05-02: 4 mg via INTRAVENOUS

## 2018-05-02 MED ORDER — DIPHENHYDRAMINE HCL 12.5 MG/5ML PO ELIX: 12.5000 mg | ORAL_SOLUTION | ORAL | Status: DC | PRN

## 2018-05-02 MED ORDER — METOCLOPRAMIDE HCL 5 MG/ML IJ SOLN: 5.0000 mg | Freq: Three times a day (TID) | INTRAMUSCULAR | Status: DC | PRN

## 2018-05-02 MED ORDER — MIDAZOLAM HCL 2 MG/2ML IJ SOLN
INTRAMUSCULAR | Status: AC
  Filled 2018-05-02: qty 2

## 2018-05-02 MED ORDER — VITAMIN D 1000 UNITS PO TABS
2000.0000 [IU] | ORAL_TABLET | Freq: Every day | ORAL | Status: DC
  Administered 2018-05-02 – 2018-05-03 (×2): 2000 [IU] via ORAL
  Filled 2018-05-02 (×2): qty 2

## 2018-05-02 MED ORDER — ONDANSETRON HCL 4 MG/2ML IJ SOLN
INTRAMUSCULAR | Status: AC
  Filled 2018-05-02: qty 2

## 2018-05-02 MED ORDER — HYDROMORPHONE HCL 1 MG/ML IJ SOLN: 0.5000 mg | INTRAMUSCULAR | Status: DC | PRN

## 2018-05-02 MED ORDER — OXYCODONE HCL 5 MG PO TABS
ORAL_TABLET | ORAL | Status: AC
  Administered 2018-05-02: 10 mg via ORAL
  Filled 2018-05-02: qty 2

## 2018-05-02 MED ORDER — PHENOL 1.4 % MT LIQD: 1.0000 | OROMUCOSAL | Status: DC | PRN

## 2018-05-02 SURGICAL SUPPLY — 67 items
BANDAGE ELASTIC 6 VELCRO ST LF (GAUZE/BANDAGES/DRESSINGS) ×2 IMPLANT
BANDAGE ESMARK 6X9 LF (GAUZE/BANDAGES/DRESSINGS) ×1 IMPLANT
BENZOIN TINCTURE PRP APPL 2/3 (GAUZE/BANDAGES/DRESSINGS) IMPLANT
BLADE SAGITTAL 25.0X1.19X90 (BLADE) ×2 IMPLANT
BLADE SAW SGTL 13X75X1.27 (BLADE) ×2 IMPLANT
BLADE SURG 10 STRL SS (BLADE) ×4 IMPLANT
BNDG ELASTIC 6X15 VLCR STRL LF (GAUZE/BANDAGES/DRESSINGS) ×2 IMPLANT
BNDG ESMARK 6X9 LF (GAUZE/BANDAGES/DRESSINGS) ×2
BOWL SMART MIX CTS (DISPOSABLE) ×2 IMPLANT
CAPT KNEE TOTAL 3 ATTUNE ×2 IMPLANT
CEMENT HV SMART SET (Cement) ×4 IMPLANT
COVER SURGICAL LIGHT HANDLE (MISCELLANEOUS) ×2 IMPLANT
CUFF TOURNIQUET SINGLE 34IN LL (TOURNIQUET CUFF) ×2 IMPLANT
CUFF TOURNIQUET SINGLE 44IN (TOURNIQUET CUFF) IMPLANT
DECANTER SPIKE VIAL GLASS SM (MISCELLANEOUS) IMPLANT
DRAPE EXTREMITY T 121X128X90 (DRAPE) ×2 IMPLANT
DRAPE HALF SHEET 40X57 (DRAPES) ×2 IMPLANT
DRAPE INCISE IOBAN 66X45 STRL (DRAPES) IMPLANT
DRAPE ORTHO SPLIT 77X108 STRL (DRAPES) ×1
DRAPE SURG ORHT 6 SPLT 77X108 (DRAPES) ×1 IMPLANT
DRAPE U-SHAPE 47X51 STRL (DRAPES) ×2 IMPLANT
DRSG AQUACEL AG ADV 3.5X10 (GAUZE/BANDAGES/DRESSINGS) ×2 IMPLANT
DURAPREP 26ML APPLICATOR (WOUND CARE) ×4 IMPLANT
ELECT CAUTERY BLADE 6.4 (BLADE) ×2 IMPLANT
ELECT REM PT RETURN 9FT ADLT (ELECTROSURGICAL) ×2
ELECTRODE REM PT RTRN 9FT ADLT (ELECTROSURGICAL) ×1 IMPLANT
FACESHIELD WRAPAROUND (MASK) ×2 IMPLANT
GLOVE BIO SURGEON STRL SZ7 (GLOVE) ×2 IMPLANT
GLOVE BIOGEL PI IND STRL 7.0 (GLOVE) ×1 IMPLANT
GLOVE BIOGEL PI IND STRL 7.5 (GLOVE) ×1 IMPLANT
GLOVE BIOGEL PI INDICATOR 7.0 (GLOVE) ×1
GLOVE BIOGEL PI INDICATOR 7.5 (GLOVE) ×1
GLOVE SS BIOGEL STRL SZ 7.5 (GLOVE) ×1 IMPLANT
GLOVE SUPERSENSE BIOGEL SZ 7.5 (GLOVE) ×1
GOWN STRL REUS W/ TWL LRG LVL3 (GOWN DISPOSABLE) ×1 IMPLANT
GOWN STRL REUS W/ TWL XL LVL3 (GOWN DISPOSABLE) ×1 IMPLANT
GOWN STRL REUS W/TWL LRG LVL3 (GOWN DISPOSABLE) ×1
GOWN STRL REUS W/TWL XL LVL3 (GOWN DISPOSABLE) ×1
HANDPIECE INTERPULSE COAX TIP (DISPOSABLE) ×1
HOOD PEEL AWAY FACE SHEILD DIS (HOOD) ×4 IMPLANT
IMMOBILIZER KNEE 22 UNIV (SOFTGOODS) ×2 IMPLANT
KIT BASIN OR (CUSTOM PROCEDURE TRAY) ×2 IMPLANT
KIT TURNOVER KIT B (KITS) ×2 IMPLANT
MANIFOLD NEPTUNE II (INSTRUMENTS) ×2 IMPLANT
MARKER SKIN DUAL TIP RULER LAB (MISCELLANEOUS) ×2 IMPLANT
NEEDLE HYPO 22GX1.5 SAFETY (NEEDLE) ×4 IMPLANT
NS IRRIG 1000ML POUR BTL (IV SOLUTION) ×2 IMPLANT
PACK TOTAL JOINT (CUSTOM PROCEDURE TRAY) ×2 IMPLANT
PAD ARMBOARD 7.5X6 YLW CONV (MISCELLANEOUS) ×4 IMPLANT
SET HNDPC FAN SPRY TIP SCT (DISPOSABLE) ×1 IMPLANT
STRIP CLOSURE SKIN 1/2X4 (GAUZE/BANDAGES/DRESSINGS) ×2 IMPLANT
SUCTION FRAZIER HANDLE 10FR (MISCELLANEOUS) ×1
SUCTION TUBE FRAZIER 10FR DISP (MISCELLANEOUS) ×1 IMPLANT
SUT MNCRL AB 3-0 PS2 18 (SUTURE) ×2 IMPLANT
SUT VIC AB 0 CT1 27 (SUTURE) ×2
SUT VIC AB 0 CT1 27XBRD ANBCTR (SUTURE) ×2 IMPLANT
SUT VIC AB 1 CT1 27 (SUTURE) ×1
SUT VIC AB 1 CT1 27XBRD ANBCTR (SUTURE) ×1 IMPLANT
SUT VIC AB 2-0 CT1 27 (SUTURE) ×2
SUT VIC AB 2-0 CT1 TAPERPNT 27 (SUTURE) ×2 IMPLANT
SYR CONTROL 10ML LL (SYRINGE) ×4 IMPLANT
TOWEL OR 17X24 6PK STRL BLUE (TOWEL DISPOSABLE) ×2 IMPLANT
TOWEL OR 17X26 10 PK STRL BLUE (TOWEL DISPOSABLE) ×2 IMPLANT
TRAY CATH 16FR W/PLASTIC CATH (SET/KITS/TRAYS/PACK) IMPLANT
TRAY FOLEY CATH SILVER 16FR (SET/KITS/TRAYS/PACK) ×2 IMPLANT
TRAY URETHRAL FOLEY CATH 14FR (CATHETERS) ×2 IMPLANT
WATER STERILE IRR 1000ML POUR (IV SOLUTION) IMPLANT

## 2018-05-02 NOTE — Evaluation (Signed)
Physical Therapy Evaluation Patient Details Name: Melissa Hunt MRN: [id number] DOB: Aug 13, 1956 Today's Date: 05/02/2018   History of Present Illness  Pt is a 62 y/o female s/p R TKA. PMH includes breast cancer, anxiety, fibromyalgia, HTN, and L TKA.   Clinical Impression  Pt is s/p surgery above with deficits below. Pt limited secondary to lightheadedness this session, so mobility limited to EOB. Required supervision to min A for basic bed mobility tasks. Educated about knee precautions and supine HEP. Feel pt will progress well once symptoms improved. Will continue to follow acutely to maximize functional mobility independence and safety.     Follow Up Recommendations Follow surgeon's recommendation for DC plan and follow-up therapies;Supervision for mobility/OOB    Equipment Recommendations  None recommended by PT    Recommendations for Other Services OT consult     Precautions / Restrictions Precautions Precautions: Knee Precaution Booklet Issued: Yes (comment) Precaution Comments: Reviewed TKA exercises and knee precautions.  Restrictions Weight Bearing Restrictions: Yes RLE Weight Bearing: Weight bearing as tolerated      Mobility  Bed Mobility Overal bed mobility: Needs Assistance Bed Mobility: Supine to Sit;Sit to Supine     Supine to sit: Supervision Sit to supine: Min assist   General bed mobility comments: Supervision for safety to come to sitting. Increased time required and use of overhead frame. Upon sitting, pt reporting increased lightheadedness and reported "things were getting dark." Assisted pt with return to supine. Checked BP and BP at 113/69 mmHg and pulse at 79 bpm. Further mobility deferred.   Transfers                    Ambulation/Gait                Stairs            Wheelchair Mobility    Modified Rankin (Stroke Patients Only)       Balance Overall balance assessment: Needs assistance Sitting-balance support:  No upper extremity supported;Feet supported Sitting balance-Leahy Scale: Fair                                       Pertinent Vitals/Pain Pain Assessment: 0-10 Pain Score: 5  Pain Location: R knee  Pain Descriptors / Indicators: Aching;Operative site guarding Pain Intervention(s): Limited activity within patient's tolerance;Monitored during session;Repositioned    Home Living Family/patient expects to be discharged to:: Private residence Living Arrangements: Spouse/significant other Available Help at Discharge: Family;Available 24 hours/day Type of Home: House Home Access: Stairs to enter;Ramped entrance Entrance Stairs-Rails: None(wide enough to fit the RW ) Entrance Stairs-Number of Steps: 3 Home Layout: Two level;Able to live on main level with bedroom/bathroom Home Equipment: Gilford Rile - 2 wheels;Shower seat;Cane - single point      Prior Function Level of Independence: Needs assistance   Gait / Transfers Assistance Needed: USed cane for ambulation   ADL's / Homemaking Assistance Needed: Requires occasional assist with RW.         Hand Dominance   Dominant Hand: Right    Extremity/Trunk Assessment   Upper Extremity Assessment Upper Extremity Assessment: Defer to OT evaluation(L shoulder/elbow/wrist pain; limited ROM in LUE )    Lower Extremity Assessment Lower Extremity Assessment: RLE deficits/detail RLE Deficits / Details: Reports some decreased sensation. Able to perform ther ex below. Deficits consistent with post op pain and weakness.     Cervical / Trunk  Assessment Cervical / Trunk Assessment: Normal  Communication   Communication: No difficulties  Cognition Arousal/Alertness: Awake/alert Behavior During Therapy: WFL for tasks assessed/performed Overall Cognitive Status: Within Functional Limits for tasks assessed                                        General Comments General comments (skin integrity, edema, etc.):  Pt's spouse present in room.     Exercises Total Joint Exercises Ankle Circles/Pumps: AROM;Both;20 reps Quad Sets: AROM;Right;10 reps Heel Slides: AROM;Right;10 reps   Assessment/Plan    PT Assessment Patient needs continued PT services  PT Problem List Decreased strength;Decreased range of motion;Decreased activity tolerance;Decreased balance;Decreased mobility;Decreased knowledge of precautions;Cardiopulmonary status limiting activity;Pain;Impaired sensation       PT Treatment Interventions DME instruction;Gait training;Stair training;Functional mobility training;Therapeutic activities;Therapeutic exercise;Balance training;Patient/family education    PT Goals (Current goals can be found in the Care Plan section)  Acute Rehab PT Goals Patient Stated Goal: to go home  PT Goal Formulation: With patient Time For Goal Achievement: 05/16/18 Potential to Achieve Goals: Good    Frequency 7X/week   Barriers to discharge        Co-evaluation               AM-PAC PT "6 Clicks" Daily Activity  Outcome Measure Difficulty turning over in bed (including adjusting bedclothes, sheets and blankets)?: A Little Difficulty moving from lying on back to sitting on the side of the bed? : A Little Difficulty sitting down on and standing up from a chair with arms (e.g., wheelchair, bedside commode, etc,.)?: Unable Help needed moving to and from a bed to chair (including a wheelchair)?: A Lot Help needed walking in hospital room?: A Lot Help needed climbing 3-5 steps with a railing? : Total 6 Click Score: 12    End of Session   Activity Tolerance: Treatment limited secondary to medical complications (Comment)(lightheadedness ) Patient left: in bed;with call bell/phone within reach;with family/visitor present Nurse Communication: Mobility status;Other (comment)(lightheadedness) PT Visit Diagnosis: Other abnormalities of gait and mobility (R26.89);History of falling (Z91.81);Unsteadiness on  feet (R26.81);Pain Pain - Right/Left: Right Pain - part of body: Knee    Time: 1350-1426 PT Time Calculation (min) (ACUTE ONLY): 36 min   Charges:   PT Evaluation $PT Eval Moderate Complexity: 1 Mod PT Treatments $Therapeutic Activity: 8-22 mins   PT G Codes:        Leighton Ruff, PT, DPT  Acute Rehabilitation Services  Pager: [phone]   Rudean Hitt 05/02/2018, 2:40 PM

## 2018-05-02 NOTE — Op Note (Signed)
MRN:     [id number] DOB/AGE:    [birth year] / 62 y.o.       OPERATIVE REPORT   DATE OF PROCEDURE:  05/02/2018      PREOPERATIVE DIAGNOSIS:   Primary Localized Osteoarthritis right Knee       Estimated body mass index is 38.33 kg/m as calculated from the following:   Height as of this encounter: 5\' 4"  (1.626 m).   Weight as of this encounter: 101.3 kg (223 lb 5.2 oz).                                                       POSTOPERATIVE DIAGNOSIS:   Same                                                                 PROCEDURE:  Procedure(s): TOTAL KNEE ARTHROPLASTY Using Depuy Attune RP implants #4 Femur, #5Tibia, 58mm  RP bearing, 32 Patella    SURGEON: Jonh Mcqueary A. Noemi Chapel, MD   ASSISTANT: Matthew Saras, PA-C, present and scrubbed throughout the case, critical for retraction, instrumentation, and closure.  ANESTHESIA: Spinal with Adductor Nerve Block  TOURNIQUET TIME: 65 minutes   COMPLICATIONS:  None       SPECIMENS: None   INDICATIONS FOR PROCEDURE: The patient has djd of the knee with varus deformities, XR shows bone on bone arthritis. Patient has failed all conservative measures including anti-inflammatory medicines, narcotics, attempts at exercise and weight loss, cortisone injections and viscosupplementation.  Risks and benefits of surgery have been discussed, questions answered.    DESCRIPTION OF PROCEDURE: The patient identified by armband, received right adductor canal block and IV antibiotics, in the holding area at [facility]. Patient taken to the operating room, appropriate anesthetic monitors were attached. Spinal anesthesia induced with the patient in supine position, Foley catheter was inserted. Tourniquet applied high to the operative thigh. Lateral post and foot positioner applied to the table, the lower extremity was then prepped and draped in usual sterile fashion from the ankle to the tourniquet. Time-out procedure was performed. The limb was wrapped with an  Esmarch bandage and the tourniquet inflated to 365 mmHg.   We began the operation by making a 6cm anterior midline incision. Small bleeders in the skin and the subcutaneous tissue identified and cauterized. Transverse retinaculum was incised and reflected medially and a medial parapatellar arthrotomy was accomplished. the patella was everted and theprepatellar fat pad resected. The superficial medial collateral ligament was then elevated from anterior to posterior along the proximal flare of the tibia and anterior half of the menisci resected. The knee was hyperflexed exposing bone on bone arthritis. Peripheral and notch osteophytes as well as the cruciate ligaments were then resected. We continued to work our way around posteriorly along the proximal tibia, and externally rotated the tibia subluxing it out from underneath the femur. A McHale retractor was placed through the notch and a lateral Hohmann retractor placed, and an external tibial guide was placed.  The tibial cutting guide was pinned into place allowing resection of 4 mm of bone medially and about 6 mm of bone  laterally because of her varus deformity.   Satisfied with the tibial resection, we then entered the distal femur 2 mm anterior to the PCL origin with the intramedullary guide rod and applied the distal femoral cutting guide set at 66mm, with 5 degrees of valgus. This was pinned along the epicondylar axis. At this point, the distal femoral cut was accomplished without difficulty. We then sized for a 4 femoral component and pinned the guide in 3 degrees of external rotation.The chamfer cutting guide was pinned into place. The anterior, posterior, and chamfer cuts were accomplished without difficulty followed by the  RP box cutting guide and the box cut. We also removed posterior osteophytes from the posterior femoral condyles. At this time, the knee was brought into full extension. We checked our extension and flexion gaps and found them  symmetric at 6.  The patella thickness measured at 60m m. We set the cutting guide at 15 and removed the posterior patella sized for 32 button and drilled the lollipop. The knee was then once again hyperflexed exposing the proximal tibia. We sized for a # 5 tibial base plate, applied the smokestack and the conical reamer followed by the the Delta fin keel punch. We then hammered into place the  RP trial femoral component, inserted a trial bearing, trial patellar button, and took the knee through range of motion from 0-130 degrees. No thumb pressure was required for patellar tracking.   At this point, all trial components were removed, a double batch of DePuy HV cement  was mixed and applied to all bony metallic mating surfaces. In order, we hammered into place the tibial tray and removed excess cement, the femoral component and removed excess cement, a 6 mm  RP bearing was inserted, and the knee brought to full extension with compression. The patellar button was clamped into place, and excess cement removed. While the cement cured the wound was irrigated out with normal saline solution pulse lavage, and exparel was injected throughout the knee. Ligament stability and patellar tracking were checked and found to be excellent..   The parapatellar arthrotomy was closed with  #1 Vicryl suture. The subcutaneous tissue with 0 and 2-0 undyed Vicryl suture, and 4-0 Monocryl.. A dressing of Aquaseal, 4 x 4, dressing sponges, Webril, and Ace wrap applied. Needle and sponge count were correct times 2.The patient awakened, extubated, and taken to recovery room without difficulty. Vascular status was normal, pulses 2+ and symmetric.    Lorn Junes 01/31/2018, 8:56 AM

## 2018-05-02 NOTE — Transfer of Care (Signed)
Immediate Anesthesia Transfer of Care Note  Patient: Melissa Hunt  Procedure(s) Performed: TOTAL KNEE ARTHROPLASTY (Right )  Patient Location: PACU  Anesthesia Type:Spinal and MAC combined with regional for post-op pain  Level of Consciousness: drowsy and patient cooperative  Airway & Oxygen Therapy: Patient Spontanous Breathing and Patient connected to face mask oxygen  Post-op Assessment: Report given to RN and Post -op Vital signs reviewed and stable  Post vital signs: Reviewed and stable  Last Vitals:  Vitals Value Taken Time  BP 113/90 05/02/2018  9:35 AM  Temp 36.2 C 05/02/2018  9:36 AM  Pulse 65 05/02/2018  9:39 AM  Resp 12 05/02/2018  9:39 AM  SpO2 97 % 05/02/2018  9:39 AM  Vitals shown include unvalidated device data.  Last Pain:  Vitals:   05/02/18 0936  TempSrc:   PainSc: 6       Patients Stated Pain Goal: 3 (16/57/90 3833)  Complications: No apparent anesthesia complications

## 2018-05-02 NOTE — Anesthesia Procedure Notes (Signed)
Anesthesia Regional Block: Adductor canal block   Pre-Anesthetic Checklist: ,, timeout performed, Correct Patient, Correct Site, Correct Laterality, Correct Procedure, Correct Position, site marked, Risks and benefits discussed,  Surgical consent,  Pre-op evaluation,  At surgeon's request and post-op pain management  Laterality: Right  Prep: chloraprep       Needles:  Injection technique: Single-shot  Needle Type: Echogenic Stimulator Needle     Needle Length: 10cm  Needle Gauge: 21   Needle insertion depth: 4 cm   Additional Needles:   Procedures:,,,, ultrasound used (permanent image in chart),,,,  Narrative:  Start time: 05/02/2018 7:00 AM End time: 05/02/2018 7:08 AM Injection made incrementally with aspirations every 5 mL.  Performed by: Personally  Anesthesiologist: Lyn Hollingshead, MD

## 2018-05-02 NOTE — Plan of Care (Signed)

## 2018-05-02 NOTE — Interval H&P Note (Signed)
History and Physical Interval Note:  05/02/2018 6:32 AM  [name]  has presented today for surgery, with the diagnosis of [id number] RIGT KNEE  The various methods of treatment have been discussed with the patient and family. After consideration of risks, benefits and other options for treatment, the patient has consented to  Procedure(s): TOTAL KNEE ARTHROPLASTY (Right) as a surgical intervention .  The patient's history has been reviewed, patient examined, no change in status, stable for surgery.  I have reviewed the patient's chart and labs.  Questions were answered to the patient's satisfaction.     Melissa Hunt

## 2018-05-02 NOTE — Anesthesia Procedure Notes (Signed)
Spinal  Start time: 05/02/2018 7:22 AM End time: 05/02/2018 7:25 AM Staffing Anesthesiologist: Lyn Hollingshead, MD Performed: anesthesiologist  Preanesthetic Checklist Completed: patient identified, site marked, surgical consent, pre-op evaluation, timeout performed, IV checked, risks and benefits discussed and monitors and equipment checked Spinal Block Patient position: sitting Prep: site prepped and draped and DuraPrep Patient monitoring: continuous pulse ox and blood pressure Approach: midline Location: L3-4 Injection technique: single-shot Needle Needle type: Pencan  Needle gauge: 24 G Needle length: 10 cm Needle insertion depth: 7 cm Assessment Sensory level: T8

## 2018-05-02 NOTE — Progress Notes (Signed)
Orthopedic Tech Progress Note Patient Details:  Melissa Hunt [facility] 1956/03/15 [id number]  CPM Right Knee CPM Right Knee: On Right Knee Flexion (Degrees): 90 Right Knee Extension (Degrees): 0 Additional Comments: Trapeze bar and foot roll  Post Interventions Patient Tolerated: Well Instructions Provided: Care of device, Adjustment of device  [name] 05/02/2018, 11:23 AM

## 2018-05-02 NOTE — Anesthesia Postprocedure Evaluation (Signed)
Anesthesia Post Note  Patient: Melissa Hunt  Procedure(s) Performed: TOTAL KNEE ARTHROPLASTY (Right )     Patient location during evaluation: PACU Anesthesia Type: Spinal Level of consciousness: sedated Pain management: pain level controlled Vital Signs Assessment: post-procedure vital signs reviewed and stable Respiratory status: spontaneous breathing Cardiovascular status: stable Postop Assessment: no apparent nausea or vomiting Anesthetic complications: no    Last Vitals:  Vitals:   05/02/18 1020 05/02/18 1035  BP: 114/65 (!) 105/59  Pulse: 71 74  Resp: 10 11  Temp:    SpO2: 92% 90%    Last Pain:  Vitals:   05/02/18 1030  TempSrc:   PainSc: 7    Pain Goal: Patients Stated Pain Goal: 3 (05/02/18 0602)               Shunsuke Granzow JR,JOHN Mateo Flow

## 2018-05-03 ENCOUNTER — Encounter (HOSPITAL_COMMUNITY): Payer: Self-pay | Admitting: Orthopedic Surgery

## 2018-05-03 DIAGNOSIS — M1711 Unilateral primary osteoarthritis, right knee: Secondary | ICD-10-CM | POA: Diagnosis not present

## 2018-05-03 LAB — CBC
HCT: 38 % (ref 36.0–46.0)
Hemoglobin: 12.2 g/dL (ref 12.0–15.0)
MCH: 30.7 pg (ref 26.0–34.0)
MCHC: 32.1 g/dL (ref 30.0–36.0)
MCV: 95.7 fL (ref 78.0–100.0)
PLATELETS: 182 10*3/uL (ref 150–400)
RBC: 3.97 MIL/uL (ref 3.87–5.11)
RDW: 12.2 % (ref 11.5–15.5)
WBC: 16.6 10*3/uL — AB (ref 4.0–10.5)

## 2018-05-03 LAB — BASIC METABOLIC PANEL
ANION GAP: 6 (ref 5–15)
BUN: 14 mg/dL (ref 8–23)
CALCIUM: 8.8 mg/dL — AB (ref 8.9–10.3)
CO2: 26 mmol/L (ref 22–32)
CREATININE: 0.64 mg/dL (ref 0.44–1.00)
Chloride: 106 mmol/L (ref 98–111)
GFR calc Af Amer: >60 mL/min (ref >60)
GLUCOSE: 151 mg/dL — AB (ref 70–99)
Potassium: 4.1 mmol/L (ref 3.5–5.1)
Sodium: 138 mmol/L (ref 135–145)

## 2018-05-03 MED ORDER — DOCUSATE SODIUM 100 MG PO CAPS: ORAL_CAPSULE | ORAL | 0 refills | Status: DC

## 2018-05-03 MED ORDER — ASPIRIN 325 MG PO TBEC: DELAYED_RELEASE_TABLET | ORAL | 0 refills | Status: DC

## 2018-05-03 MED ORDER — OXYCODONE HCL 5 MG PO TABS: ORAL_TABLET | ORAL | 0 refills | Status: DC

## 2018-05-03 MED ORDER — POLYETHYLENE GLYCOL 3350 17 G PO PACK: PACK | ORAL | 0 refills | Status: AC

## 2018-05-03 NOTE — Progress Notes (Signed)
Physical Therapy Treatment Patient Details Name: Melissa Hunt MRN: [id number] DOB: [birth year] Today's Date: 05/03/2018    History of Present Illness Pt is a 62 y/o female s/p R TKA. PMH includes breast cancer, anxiety, fibromyalgia, HTN, and L TKA.     PT Comments    Pt progressing towards goals. BP WFL throughout; see vitals flowsheet. Pt requiring min guard to supervision throughout gait and stair navigation and overall steady using RW. Pt reports husband able to assist at home.  Completed all educated about supine HEP. Will continue to follow acutely to maximize functional mobility independence and safety.   Follow Up Recommendations  Follow surgeon's recommendation for DC plan and follow-up therapies;Supervision for mobility/OOB     Equipment Recommendations  None recommended by PT    Recommendations for Other Services OT consult     Precautions / Restrictions Precautions Precautions: Knee Precaution Booklet Issued: Yes (comment) Precaution Comments: Progressed TKA exercises  Restrictions Weight Bearing Restrictions: Yes RLE Weight Bearing: Weight bearing as tolerated    Mobility  Bed Mobility Overal bed mobility: Needs Assistance Bed Mobility: Supine to Sit     Supine to sit: Supervision     General bed mobility comments: Supervision for safety.   Transfers Overall transfer level: Needs assistance Equipment used: Rolling walker (2 wheeled) Transfers: Sit to/from Stand Sit to Stand: Min guard;Supervision         General transfer comment: Min guard for steadying assist from lower surface. Supervision for safety from elevated surface. Verbal cues for safe hand placement.   Ambulation/Gait Ambulation/Gait assistance: Min guard;Supervision Gait Distance (Feet): 250 Feet Assistive device: Rolling walker (2 wheeled) Gait Pattern/deviations: Step-through pattern;Antalgic;Decreased stance time - left;Decreased stance time - right;Decreased weight shift to  right Gait velocity: Decreased    General Gait Details: Slow, cautious gait. Step through gait pattern with cues for heel strike and knee extension on the R.    Stairs Stairs: Yes Stairs assistance: Min guard Stair Management: Step to pattern;Forwards;With walker Number of Stairs: 2 General stair comments: Pt overall steady with stair navigation. Min guard for safety. Verbal cues for sequencing with RW. Educated about husband assisting at home.    Wheelchair Mobility    Modified Rankin (Stroke Patients Only)       Balance Overall balance assessment: Needs assistance Sitting-balance support: No upper extremity supported;Feet supported Sitting balance-Leahy Scale: Good     Standing balance support: Bilateral upper extremity supported;During functional activity Standing balance-Leahy Scale: Poor Standing balance comment: Reliant on BUE support                             Cognition Arousal/Alertness: Awake/alert Behavior During Therapy: WFL for tasks assessed/performed Overall Cognitive Status: Within Functional Limits for tasks assessed                                        Exercises Total Joint Exercises Quad Sets: AROM;Right;10 reps Short Arc Quad: AROM;Right;10 reps Hip ABduction/ADduction: AROM;Right;10 reps Straight Leg Raises: AROM;Right;5 reps Long [employer]: (verbally reviewed with demonstration) Knee Flexion: (verbally reviewed with demonstration) Goniometric ROM: 15-110    General Comments General comments (skin integrity, edema, etc.): BP's WFL throughout. Pt's spouse present during session. Provided safety educated about assist level for steps and about safety with dogs at home.       Pertinent Vitals/Pain Pain Assessment: 0-10  Pain Score: 5  Pain Location: R knee  Pain Descriptors / Indicators: Aching;Operative site guarding Pain Intervention(s): Limited activity within patient's tolerance;Monitored during  session;Repositioned    Home Living                      Prior Function            PT Goals (current goals can now be found in the care plan section) Acute Rehab PT Goals Patient Stated Goal: to go home  PT Goal Formulation: With patient Time For Goal Achievement: 05/16/18 Potential to Achieve Goals: Good Progress towards PT goals: Progressing toward goals    Frequency    7X/week      PT Plan Current plan remains appropriate    Co-evaluation              AM-PAC PT "6 Clicks" Daily Activity  Outcome Measure  Difficulty turning over in bed (including adjusting bedclothes, sheets and blankets)?: A Little Difficulty moving from lying on back to sitting on the side of the bed? : A Little Difficulty sitting down on and standing up from a chair with arms (e.g., wheelchair, bedside commode, etc,.)?: A Little Help needed moving to and from a bed to chair (including a wheelchair)?: A Little Help needed walking in hospital room?: A Little Help needed climbing 3-5 steps with a railing? : A Little 6 Click Score: 18    End of Session Equipment Utilized During Treatment: Gait belt Activity Tolerance: Patient tolerated treatment well Patient left: in chair;with call bell/phone within reach;with family/visitor present Nurse Communication: Mobility status PT Visit Diagnosis: Other abnormalities of gait and mobility (R26.89);History of falling (Z91.81);Unsteadiness on feet (R26.81);Pain Pain - Right/Left: Right Pain - part of body: Knee     Time: 1012-1040 PT Time Calculation (min) (ACUTE ONLY): 28 min  Charges:  $Gait Training: 8-22 mins $Therapeutic Exercise: 8-22 mins                    G Codes:       Leighton Ruff, PT, DPT  Acute Rehabilitation Services  Pager: [phone]    Melissa Hunt 05/03/2018, 10:59 AM

## 2018-05-03 NOTE — Progress Notes (Signed)
Provided discharge education/instructions, all questions and concerns addressed, Pt not in distress, discharged home with belongings accompanied by husband. 

## 2018-05-03 NOTE — Progress Notes (Signed)
Occupational Therapy Evaluation Patient Details Name: Melissa Hunt MRN: [id number] DOB: 12-11-1955 Today's Date: 05/03/2018    History of Present Illness Pt is a 62 y/o female s/p R TKA. PMH includes breast cancer, anxiety, fibromyalgia, HTN, and L TKA.    Clinical Impression   Prior to admission patient reports requiring assistance with LB dressing (socks/shoes), but otherwise could complete ADL, light IADL with independence, using cane for mobility.  She currently demonstrates ability to complete UB ADL with supervision, LB ADL with min assist, toileting and toilet transfers using RW with supervision, and simulated shower transfers with min guard assist.  Patient educated on precautions, safety (supervision for bathing, sitting in shower, supervision for mobility), DME, bathroom transfers, pacing, and beneficial AE related to impairments s/p R TKA.  Patient verbalizes understanding of all recommendations, spouse present throughout session and agreeable.  Spouse is able to provide 24/7 support at dc, patient only has to access 1st floor of home.  No DME needed, as patient has handicapped height commodes and shower seat.  No follow up OT recommended. Thank you for this referral.  OT signing off.     Follow Up Recommendations  No OT follow up;Supervision - Intermittent    Equipment Recommendations  None recommended by OT    Recommendations for Other Services       Precautions / Restrictions Precautions Precautions: Knee Precaution Booklet Issued: No(issued in prior PT session) Precaution Comments: reviewed knee precautions Restrictions Weight Bearing Restrictions: Yes RLE Weight Bearing: Weight bearing as tolerated      Mobility Bed Mobility Overal bed mobility: Needs Assistance Bed Mobility: Supine to Sit     Supine to sit: Supervision     General bed mobility comments: seated in recliner upon therapist entering room  Transfers Overall transfer level: Needs  assistance Equipment used: Rolling walker (2 wheeled) Transfers: Sit to/from Stand Sit to Stand: Supervision         General transfer comment: supervision for safety, good recall of hand placement and walker management     Balance Overall balance assessment: Needs assistance Sitting-balance support: No upper extremity supported;Feet supported Sitting balance-Leahy Scale: Good Sitting balance - Comments: forward reach to B feet with no assist   Standing balance support: During functional activity;No upper extremity supported Standing balance-Leahy Scale: Fair Standing balance comment: able to complete self care tasks 0 hand support, no external support                           ADL either performed or assessed with clinical judgement   ADL Overall ADL's : Needs assistance/impaired Eating/Feeding: Independent   Grooming: Supervision/safety;Standing(RW)   Upper Body Bathing: Supervision/ safety;Sitting   Lower Body Bathing: Minimal assistance;Sit to/from stand;Cueing for compensatory techniques Lower Body Bathing Details (indicate cue type and reason): educated on safety sitting in shower, use of long handled sponge to reach R foot Upper Body Dressing : Set up;Sitting   Lower Body Dressing: Sit to/from stand;Minimal assistance;Cueing for compensatory techniques Lower Body Dressing Details (indicate cue type and reason): requires assistance with B socks, educated on AE but delinced reporting spouse will assist  Toilet Transfer: Copy Details (indicate cue type and reason): simulated to recliner, good safety awareness, hand placement and walker management Toileting- Clothing Manipulation and Hygiene: Supervision/safety;Sit to/from stand   Tub/ Shower Transfer: Walk-in shower;Min guard;Ambulation;Shower Scientist, research (medical) Details (indicate cue type and reason): simulated home setup, using rolling walker to step over backwards; min  cueing for recall of technique Functional mobility during ADLs: Supervision/safety;Rolling walker General ADL Comments: highly motivated, spouse can assist as needed     Vision Baseline Vision/History: Wears glasses Wears Glasses: At all times Patient Visual Report: No change from baseline Vision Assessment?: No apparent visual deficits     Perception     Praxis      Pertinent Vitals/Pain Pain Assessment: 0-10 Pain Score: 5  Pain Location: R knee  Pain Descriptors / Indicators: Aching;Operative site guarding Pain Intervention(s): Monitored during session;Repositioned     Hand Dominance Right   Extremity/Trunk Assessment Upper Extremity Assessment Upper Extremity Assessment: Generalized weakness   Lower Extremity Assessment Lower Extremity Assessment: Defer to PT evaluation RLE Deficits / Details: s/p TKA   Cervical / Trunk Assessment Cervical / Trunk Assessment: Normal   Communication Communication Communication: No difficulties   Cognition Arousal/Alertness: Awake/alert Behavior During Therapy: WFL for tasks assessed/performed Overall Cognitive Status: Within Functional Limits for tasks assessed                                     General Comments  spouse present throughout session        Shoulder Instructions      Home Living Family/patient expects to be discharged to:: Private residence Living Arrangements: Spouse/significant other Available Help at Discharge: Family;Available 24 hours/day Type of Home: House Home Access: Stairs to enter;Ramped entrance Entrance Stairs-Number of Steps: 3 Entrance Stairs-Rails: None Home Layout: Two level;Able to live on main level with bedroom/bathroom     Bathroom Shower/Tub: Walk-in shower;Tub/shower unit(will use walk in shower)   Bathroom Toilet: Handicapped height Bathroom Accessibility: Yes How Accessible: Accessible via walker Home Equipment: [place] - 2 wheels;Shower seat;Cane - single  point          Prior Functioning/Environment Level of Independence: Needs assistance  Gait / Transfers Assistance Needed: used cane for mobility ADL's / Homemaking Assistance Needed: reports required assist with socks, completed only light IADL            OT Problem List: Decreased strength;Decreased range of motion;Decreased activity tolerance;Impaired balance (sitting and/or standing);Decreased safety awareness;Decreased knowledge of precautions;Pain      OT Treatment/Interventions:      OT Goals(Current goals can be found in the care plan section) Acute Rehab OT Goals Patient Stated Goal: to go home  OT Goal Formulation: With patient/family  OT Frequency:     Barriers to D/C:            Co-evaluation              AM-PAC PT "6 Clicks" Daily Activity     Outcome Measure Help from another person eating meals?: None Help from another person taking care of personal grooming?: A Little Help from another person toileting, which includes using toliet, bedpan, or urinal?: A Little Help from another person bathing (including washing, rinsing, drying)?: A Little Help from another person to put on and taking off regular upper body clothing?: None Help from another person to put on and taking off regular lower body clothing?: A Little 6 Click Score: 20   End of Session Equipment Utilized During Treatment: Gait belt;Rolling walker CPM Right Knee CPM Right Knee: Off Nurse Communication: Mobility status  Activity Tolerance: Patient tolerated treatment well;No increased pain Patient left: with call bell/phone within reach;with family/visitor present;in chair  OT Visit Diagnosis: Unsteadiness on feet (R26.81);Muscle weakness (generalized) (M62.81);Pain Pain - Right/Left:  Right Pain - part of body: Knee                Time: 0350-0938 OT Time Calculation (min): 23 min Charges:  OT General Charges $OT Visit: 1 Visit OT Evaluation $OT Eval Moderate Complexity: 1  Mod G-Codes:     Delight Stare, OTR/L  Pager [phone]   Delight Stare 05/03/2018, 12:58 PM

## 2018-05-03 NOTE — Discharge Summary (Signed)
Patient ID: Melissa Hunt MRN: [id number] DOB/AGE: [birth year] 62 y.o.  Admit date: 05/02/2018 Discharge date: 05/03/2018  Admission Diagnoses:  Principal Problem:   Primary localized osteoarthritis of right knee Active Problems:   Atypical ductal hyperplasia of breast   Pseudoangiomatous stromal hyperplasia of breast   S/P total knee arthroplasty, left   Essential hypertension   Low serum vitamin D   Hyperlipidemia, mixed   Psoriasis, unspecified   Acquired hypothyroidism   Discharge Diagnoses:  Same  Past Medical History:  Diagnosis Date  . Anxiety   . Arthritis 2008  . Breast discharge 2 weeks ago   LEFT BREAST BLOODY  . Essential hypertension 04/13/2018  . Fibromyalgia   . Hypertension 2000  . Hypothyroidism   . Lump or mass in breast   . Neoplasm of uncertain behavior of breast   . 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 Primary localized osteoarthritis of knee    Right  . Primary localized osteoarthritis of left knee 09/06/2017  . Primary localized osteoarthritis of right knee 04/13/2018  . S/P total knee arthroplasty, left 04/13/2018    Surgeries: Procedure(s): TOTAL KNEE ARTHROPLASTY on 05/02/2018   Consultants:   Discharged Condition: Improved  Hospital Course: Melissa Hunt is an 62 y.o. female who was admitted 05/02/2018 for operative treatment ofPrimary localized osteoarthritis of right knee. Patient has severe unremitting pain that affects sleep, daily activities, and work/hobbies. After pre-op clearance the patient was taken to the operating room on 05/02/2018 and underwent  Procedure(s): TOTAL KNEE ARTHROPLASTY.    Patient was given perioperative antibiotics:  Anti-infectives (From admission, onward)   Start     Dose/Rate Route Frequency Ordered Stop   05/02/18 1300  ceFAZolin (ANCEF) IVPB 2g/100 mL premix     2 g 200 mL/hr over 30 Minutes Intravenous Every 6 hours 05/02/18 1052 05/02/18 2010   05/02/18 0600  ceFAZolin  (ANCEF) IVPB 2g/100 mL premix     2 g 200 mL/hr over 30 Minutes Intravenous On call to O.R. 05/02/18 0543 05/02/18 0737       Patient was given sequential compression devices, early ambulation, and chemoprophylaxis to prevent DVT.  Patient benefited maximally from hospital stay and there were no complications.    Recent vital signs:  Patient Vitals for the past 24 hrs:  BP Temp Temp src Pulse Resp SpO2  05/03/18 0445 135/67 98.1 F (36.7 C) Oral 71 16 97 %  05/03/18 0005 118/70 98.2 F (36.8 C) Oral 70 18 98 %  05/02/18 1954 107/63 98.2 F (36.8 C) Oral 73 18 99 %  05/02/18 1100 112/65 97.9 F (36.6 C) Oral 74 16 92 %  05/02/18 1035 (!) 105/59 - - 74 11 90 %  05/02/18 1020 114/65 - - 71 10 92 %  05/02/18 1005 131/82 - - 68 11 93 %  05/02/18 0950 111/61 - - 71 10 100 %  05/02/18 0936 - (!) 97.2 F (36.2 C) - - - -  05/02/18 0935 113/90 - - 69 (!) 28 94 %     Recent laboratory studies:  Recent Labs    05/03/18 0557  WBC 16.6*  HGB 12.2  HCT 38.0  PLT 182  NA 138  K 4.1  CL 106  CO2 26  BUN 14  CREATININE 0.64  GLUCOSE 151*  CALCIUM 8.8*     Discharge Medications:   Allergies as of 05/03/2018   No Known Allergies     Medication List  STOP taking these medications   hydrochlorothiazide 12.5 MG capsule Commonly known as:  MICROZIDE   meloxicam 7.5 MG tablet Commonly known as:  MOBIC   naproxen sodium 220 MG tablet Commonly known as:  ALEVE   olmesartan 20 MG tablet Commonly known as:  BENICAR   traMADol 50 MG tablet Commonly known as:  ULTRAM     TAKE these medications   aspirin 325 MG EC tablet 1 tab a day for the next 30 days to prevent blood clots   clobetasol cream 0.05 % Commonly known as:  TEMOVATE Apply 1 application topically 2 (two) times daily as needed (dermatosis).   docusate sodium 100 MG capsule Commonly known as:  COLACE 1 tab 2 times a day while on narcotics.  STOOL SOFTENER   FLUoxetine 40 MG capsule Commonly known as:   PROZAC Take 40 mg by mouth daily.   folic acid 1 MG tablet Commonly known as:  FOLVITE Take 1 mg by mouth daily.   gabapentin 300 MG capsule Commonly known as:  NEURONTIN Take 300 mg by mouth at bedtime.   levothyroxine 150 MCG tablet Commonly known as:  SYNTHROID, LEVOTHROID Take 150 mcg by mouth daily before breakfast.   MAGNESIUM CITRATE PO Take 400 mg by mouth at bedtime.   methotrexate 2.5 MG tablet Commonly known as:  RHEUMATREX Take 15 mg by mouth once a week.   multivitamin with minerals tablet Take 1 tablet by mouth daily.   mupirocin ointment 2 % Commonly known as:  BACTROBAN Apply 1 application topically 2 (two) times daily.   oxyCODONE 5 MG immediate release tablet Commonly known as:  Oxy IR/ROXICODONE 1 po q 4 hrs prn pain.  Patient had a total knee replacement on 05/02/2018   polyethylene glycol packet Commonly known as:  MIRALAX / GLYCOLAX 17grams in 6 oz of something to drink twice a day until bowel movement.  LAXITIVE.  Restart if two days since last bowel movement   tiZANidine 4 MG tablet Commonly known as:  ZANAFLEX Take 2-4 mg by mouth See admin instructions. Take 1 tablet (4 mg) by mouth scheduled at bedtime each night, may take 0.5 tablet-1 tablet (2mg -4 mg) twice daily as needed for spasms.   Vitamin D3 2000 units Tabs Take 2,000 Units by mouth daily.            Discharge Care Instructions  (From admission, onward)        Start     Ordered   05/03/18 0000  Change dressing    Comments:  DO NOT REMOVE BANDAGE OVER SURGICAL INCISION.  [place] WHOLE LEG INCLUDING OVER THE WATERPROOF BANDAGE WITH SOAP AND WATER EVERY DAY.   05/03/18 0902      Diagnostic Studies: No results found.  Disposition: Discharge disposition: 01-Home or Self Care       Discharge Instructions    CPM   Complete by:  As directed    Continuous passive motion machine (CPM):      Use the CPM from 0 to 90 for 6 hours per day.       You may break it up into 2 or  3 sessions per day.      Use CPM for 2 weeks or until you are told to stop.   Call MD / Call 911   Complete by:  As directed    If you experience chest pain or shortness of breath, CALL 911 and be transported to the hospital emergency room.  If you develope  a fever above 101 F, pus (white drainage) or increased drainage or redness at the wound, or calf pain, call your surgeon's office.   Change dressing   Complete by:  As directed    DO NOT REMOVE BANDAGE OVER SURGICAL INCISION.  [place] WHOLE LEG INCLUDING OVER THE WATERPROOF BANDAGE WITH SOAP AND WATER EVERY DAY.   Constipation Prevention   Complete by:  As directed    Drink plenty of fluids.  Prune juice may be helpful.  You may use a stool softener, such as Colace (over the counter) 100 mg twice a day.  Use MiraLax (over the counter) for constipation as needed.   Diet - low sodium heart healthy   Complete by:  As directed    Discharge instructions   Complete by:  As directed    INSTRUCTIONS AFTER JOINT REPLACEMENT   Remove items at home which could result in a fall. This includes throw rugs or furniture in walking pathways ICE to the affected joint every three hours while awake for 30 minutes at a time, for at least the first 3-5 days, and then as needed for pain and swelling.  Continue to use ice for pain and swelling. You may notice swelling that will progress down to the foot and ankle.  This is normal after surgery.  Elevate your leg when you are not up walking on it.   Continue to use the breathing machine you got in the hospital (incentive spirometer) which will help keep your temperature down.  It is common for your temperature to cycle up and down following surgery, especially at night when you are not up moving around and exerting yourself.  The breathing machine keeps your lungs expanded and your temperature down.   DIET:  As you were doing prior to hospitalization, we recommend a well-balanced diet.  DRESSING / WOUND CARE /  SHOWERING  Keep the surgical dressing until follow up.  The dressing is water proof, so you can shower without any extra covering.  IF THE DRESSING FALLS OFF or the wound gets wet inside, change the dressing with sterile gauze.  Please use good hand washing techniques before changing the dressing.  Do not use any lotions or creams on the incision until instructed by your surgeon.    ACTIVITY  Increase activity slowly as tolerated, but follow the weight bearing instructions below.   No driving for 6 weeks or until further direction given by your physician.  You cannot drive while taking narcotics.  No lifting or carrying greater than 10 lbs. until further directed by your surgeon. Avoid periods of inactivity such as sitting longer than an hour when not asleep. This helps prevent blood clots.  You may return to work once you are authorized by your doctor.     WEIGHT BEARING   Weight bearing as tolerated with assist device (walker, cane, etc) as directed, use it as long as suggested by your surgeon or therapist, typically at least 2-3 weeks.   EXERCISES  Results after joint replacement surgery are often greatly improved when you follow the exercise, range of motion and muscle strengthening exercises prescribed by your doctor. Safety measures are also important to protect the joint from further injury. Any time any of these exercises cause you to have increased pain or swelling, decrease what you are doing until you are comfortable again and then slowly increase them. If you have problems or questions, call your caregiver or physical therapist for advice.   Rehabilitation is important  following a joint replacement. After just a few days of immobilization, the muscles of the leg can become weakened and shrink (atrophy).  These exercises are designed to build up the tone and strength of the thigh and leg muscles and to improve motion. Often times heat used for twenty to thirty minutes before working  out will loosen up your tissues and help with improving the range of motion but do not use heat for the first two weeks following surgery (sometimes heat can increase post-operative swelling).   These exercises can be done on a training (exercise) mat, on the floor, on a table or on a bed. Use whatever works the best and is most comfortable for you.    Use music or television while you are exercising so that the exercises are a pleasant break in your day. This will make your life better with the exercises acting as a break in your routine that you can look forward to.   Perform all exercises about fifteen times, three times per day or as directed.  You should exercise both the operative leg and the other leg as well.   Exercises include:  Quad Sets - Tighten up the muscle on the front of the thigh (Quad) and hold for 5-10 seconds.   Straight Leg Raises - With your knee straight (if you were given a brace, keep it on), lift the leg to 60 degrees, hold for 3 seconds, and slowly lower the leg.  Perform this exercise against resistance later as your leg gets stronger.  Leg Slides: Lying on your back, slowly slide your foot toward your buttocks, bending your knee up off the floor (only go as far as is comfortable). Then slowly slide your foot back down until your leg is flat on the floor again.  Angel Wings: Lying on your back spread your legs to the side as far apart as you can without causing discomfort.  Hamstring Strength:  Lying on your back, push your heel against the floor with your leg straight by tightening up the muscles of your buttocks.  Repeat, but this time bend your knee to a comfortable angle, and push your heel against the floor.  You may put a pillow under the heel to make it more comfortable if necessary.   A rehabilitation program following joint replacement surgery can speed recovery and prevent re-injury in the future due to weakened muscles. Contact your doctor or a physical therapist  for more information on knee rehabilitation.    CONSTIPATION  Constipation is defined medically as fewer than three stools per week and severe constipation as less than one stool per week.  Even if you have a regular bowel pattern at home, your normal regimen is likely to be disrupted due to multiple reasons following surgery.  Combination of anesthesia, postoperative narcotics, change in appetite and fluid intake all can affect your bowels.   YOU MUST use at least one of the following options; they are listed in order of increasing strength to get the job done.  They are all available over the counter, and you may need to use some, POSSIBLY even all of these options:    Drink plenty of fluids (prune juice may be helpful) and high fiber foods Colace 100 mg by mouth twice a day  Senokot for constipation as directed and as needed Dulcolax (bisacodyl), take with full glass of water  Miralax (polyethylene glycol) once or twice a day as needed.  If you have tried all these  things and are unable to have a bowel movement in the first 3-4 days after surgery call either your surgeon or your primary doctor.    If you experience loose stools or diarrhea, hold the medications until you stool forms back up.  If your symptoms do not get better within 1 week or if they get worse, check with your doctor.  If you experience "the worst abdominal pain ever" or develop nausea or vomiting, please contact the office immediately for further recommendations for treatment.   ITCHING:  If you experience itching with your medications, try taking only a single pain pill, or even half a pain pill at a time.  You can also use Benadryl over the counter for itching or also to help with sleep.   TED HOSE STOCKINGS:  Use stockings on both legs until for at least 2 weeks or as directed by physician office. They may be removed at night for sleeping.  MEDICATIONS:  See your medication summary on the "After Visit Summary" that  nursing will review with you.  You may have some home medications which will be placed on hold until you complete the course of blood thinner medication.  It is important for you to complete the blood thinner medication as prescribed.  PRECAUTIONS:  If you experience chest pain or shortness of breath - call 911 immediately for transfer to the hospital emergency department.   If you develop a fever greater that 101 F, purulent drainage from wound, increased redness or drainage from wound, foul odor from the wound/dressing, or calf pain - CONTACT YOUR SURGEON.                                                   FOLLOW-UP APPOINTMENTS:  If you do not already have a post-op appointment, please call the office for an appointment to be seen by your surgeon.  Guidelines for how soon to be seen are listed in your "After Visit Summary", but are typically between 1-4 weeks after surgery.  OTHER INSTRUCTIONS:   Knee Replacement:  Do not place pillow under knee, focus on keeping the knee straight while resting. CPM instructions: 0-90 degrees, 2 hours in the morning, 2 hours in the afternoon, and 2 hours in the evening. Place foam block, curve side up under heel at all times except when in CPM or when walking.  DO NOT modify, tear, cut, or change the foam block in any way.  MAKE SURE YOU:  Understand these instructions.  Get help right away if you are not doing well or get worse.    Thank you for letting us be a part of your medical care team.  It is a privilege we respect greatly.  We hope these instructions will help you stay on track for a fast and full recovery!   Do not put a pillow under the knee. Place it under the heel.   Complete by:  As directed    Place gray foam block, curve side up under heel at all times except when in CPM or when walking.  DO NOT modify, tear, cut, or change in any way the gray foam block.   Increase activity slowly as tolerated   Complete by:  As directed    Patient may  shower   Complete by:  As directed    Aquacel dressing  is water proof    Wash over it and the whole leg with soap and water at the end of your shower   TED hose   Complete by:  As directed    Use stockings (TED hose) for 2 weeks on both leg(s).  You may remove them at night for sleeping.      Follow-up Information    Elsie Saas, MD Follow up on 05/16/2018.   Specialty:  Orthopedic Surgery Why:  appointment time 4:30 pm Contact information: [place] [address] [phone]        [facility] Follow up on 05/17/2018.   Why:  arrive at 9:30 for 10 am physical therapy appointment with Charolotte Eke information: [facility] [address] [place], [place]  [address]  [phone]           Signed: Linda Hedges 05/03/2018, 9:08 AM

## 2018-05-03 NOTE — Plan of Care (Signed)

## 2018-05-03 NOTE — Care Management Note (Signed)
Case Management Note  Patient Details  Name: Melissa Hunt MRN: [id number] Date of Birth: 1956/09/20  Subjective/Objective:  62 yr old female s/p right total knee arthroplasty.                  Action/Plan:  Patient was preoperatively setup with [facility], no changes. Patient will have family support at discharge.    Expected Discharge Date:  05/03/18               Expected Discharge Plan:  [place]  In-House Referral:  NA  Discharge planning Services  CM Consult  Post Acute Care Choice:  Durable Medical Equipment, Home Health Choice offered to:  Patient  DME Arranged:  CPM(has RW and 3in1) DME Agency:  [employer]  HH Arranged:  PT HH Agency:  [facility] (formerly [employer])  Status of Service:  Completed, signed off  If discussed at [employer], dates discussed:    Additional Comments:  Ninfa Meeker, RN 05/03/2018, 11:31 AM

## 2018-05-27 ENCOUNTER — Other Ambulatory Visit: Payer: Self-pay | Admitting: Internal Medicine

## 2018-05-27 DIAGNOSIS — R4189 Other symptoms and signs involving cognitive functions and awareness: Secondary | ICD-10-CM

## 2018-05-27 DIAGNOSIS — G9349 Other encephalopathy: Secondary | ICD-10-CM

## 2018-06-06 ENCOUNTER — Ambulatory Visit
Admission: RE | Admit: 2018-06-06 | Discharge: 2018-06-06 | Disposition: A | Discharge status: Home / Self Care | Payer: Managed Care, Other (non HMO) | Source: Ambulatory Visit | Attending: Internal Medicine | Admitting: Internal Medicine

## 2018-06-06 DIAGNOSIS — G9389 Other specified disorders of brain: Secondary | ICD-10-CM | POA: Insufficient documentation

## 2018-06-06 DIAGNOSIS — R4189 Other symptoms and signs involving cognitive functions and awareness: Secondary | ICD-10-CM | POA: Diagnosis not present

## 2018-06-06 DIAGNOSIS — G319 Degenerative disease of nervous system, unspecified: Secondary | ICD-10-CM | POA: Insufficient documentation

## 2018-06-06 DIAGNOSIS — G9349 Other encephalopathy: Secondary | ICD-10-CM | POA: Diagnosis present

## 2018-06-06 MED ORDER — GADOBENATE DIMEGLUMINE 529 MG/ML IV SOLN
20.0000 mL | Freq: Once | INTRAVENOUS | Status: AC | PRN
  Administered 2018-06-06: 20 mL via INTRAVENOUS

## 2018-06-24 ENCOUNTER — Other Ambulatory Visit: Payer: Self-pay | Admitting: Neurosurgery

## 2018-07-14 ENCOUNTER — Other Ambulatory Visit: Payer: Self-pay | Admitting: Neurosurgery

## 2018-07-28 DIAGNOSIS — Z9989 Dependence on other enabling machines and devices: Secondary | ICD-10-CM

## 2018-07-28 DIAGNOSIS — G4733 Obstructive sleep apnea (adult) (pediatric): Secondary | ICD-10-CM | POA: Insufficient documentation

## 2018-08-01 NOTE — Pre-Procedure Instructions (Signed)
Regena Delucchi [facility]  08/01/2018      [id number] [address], [place] AT [address] [place] [address] Phone: [phone] Fax: [phone]    Your procedure is scheduled on Oct. 10  Report to [facility] Admitting at 6:30  A.M.  Call this number if you have problems the morning of surgery:  [phone]   Remember:  Do not eat or drink after midnight.      Take these medicines the morning of surgery with A SIP OF WATER :               Tylenol if needed              escitalopram (lexapro)              Levothyroxine (synthroid)               7 days prior to surgery STOP taking any Aspirin(unless otherwise instructed by your surgeon), mobic, Aleve, Naproxen, Ibuprofen, Motrin, Advil, Goody's, BC's, all herbal medications, fish oil, and all vitamins          Follow your surgeon's instructions on when to stop Asprin.  If no instructions were given by your surgeon then you will need to call the office to get those instructions.                    Do not wear jewelry, make-up or nail polish.  Do not wear lotions, powders, or perfumes, or deodorant.  Do not shave 48 hours prior to surgery.  Men may shave face and neck.  Do not bring valuables to the hospital.  [facility] is not responsible for any belongings or valuables.  Contacts, dentures or bridgework may not be worn into surgery.  Leave your suitcase in the car.  After surgery it may be brought to your room.  For patients admitted to the hospital, discharge time will be determined by your treatment team.  Patients discharged the day of surgery will not be allowed to drive home.    Special instructions:  [facility]- Preparing For Surgery  Before surgery, you can play an important role. Because skin is not sterile, your skin needs to be as free of germs as possible. You can reduce the number of germs on your skin by washing with  CHG (chlorahexidine gluconate) Soap before surgery.  CHG is an antiseptic cleaner which kills germs and bonds with the skin to continue killing germs even after washing.    Oral Hygiene is also important to reduce your risk of infection.  Remember - BRUSH YOUR TEETH THE MORNING OF SURGERY WITH YOUR REGULAR TOOTHPASTE  Please do not use if you have an allergy to CHG or antibacterial soaps. If your skin becomes reddened/irritated stop using the CHG.  Do not shave (including legs and underarms) for at least 48 hours prior to first CHG shower. It is OK to shave your face.  Please follow these instructions carefully.   1. Shower the NIGHT BEFORE SURGERY and the MORNING OF SURGERY with CHG.   2. If you chose to wash your hair, wash your hair first as usual with your normal shampoo.  3. After you shampoo, rinse your hair and body thoroughly to remove the shampoo.  4. Use CHG as you would any other liquid soap. You can apply CHG directly to the skin and wash gently  with a scrungie or a clean washcloth.   5. Apply the CHG Soap to your body ONLY FROM THE NECK DOWN.  Do not use on open wounds or open sores. Avoid contact with your eyes, ears, mouth and genitals (private parts). Wash Face and genitals (private parts)  with your normal soap.  6. Wash thoroughly, paying special attention to the area where your surgery will be performed.  7. Thoroughly rinse your body with warm water from the neck down.  8. DO NOT shower/wash with your normal soap after using and rinsing off the CHG Soap.  9. Pat yourself dry with a CLEAN TOWEL.  10. Wear CLEAN PAJAMAS to bed the night before surgery, wear comfortable clothes the morning of surgery  11. Place CLEAN SHEETS on your bed the night of your first shower and DO NOT SLEEP WITH PETS.    Day of Surgery:  Do not apply any deodorants/lotions.  Please wear clean clothes to the hospital/surgery center.   Remember to brush your teeth WITH YOUR REGULAR  TOOTHPASTE.    Please read over the following fact sheets that you were given. Coughing and Deep Breathing, MRSA Information and Surgical Site Infection Prevention

## 2018-08-02 ENCOUNTER — Encounter (HOSPITAL_COMMUNITY)
Admission: RE | Admit: 2018-08-02 | Discharge: 2018-08-02 | Disposition: A | Discharge status: Home / Self Care | Payer: Managed Care, Other (non HMO) | Source: Ambulatory Visit | Attending: Neurosurgery | Admitting: Neurosurgery

## 2018-08-02 ENCOUNTER — Encounter (HOSPITAL_COMMUNITY): Payer: Self-pay

## 2018-08-02 ENCOUNTER — Other Ambulatory Visit: Payer: Self-pay

## 2018-08-02 DIAGNOSIS — E039 Hypothyroidism, unspecified: Secondary | ICD-10-CM | POA: Diagnosis not present

## 2018-08-02 DIAGNOSIS — M797 Fibromyalgia: Secondary | ICD-10-CM | POA: Insufficient documentation

## 2018-08-02 DIAGNOSIS — Z01812 Encounter for preprocedural laboratory examination: Secondary | ICD-10-CM | POA: Insufficient documentation

## 2018-08-02 DIAGNOSIS — Z79899 Other long term (current) drug therapy: Secondary | ICD-10-CM | POA: Diagnosis not present

## 2018-08-02 DIAGNOSIS — I10 Essential (primary) hypertension: Secondary | ICD-10-CM | POA: Diagnosis not present

## 2018-08-02 DIAGNOSIS — M4316 Spondylolisthesis, lumbar region: Secondary | ICD-10-CM | POA: Insufficient documentation

## 2018-08-02 DIAGNOSIS — G4733 Obstructive sleep apnea (adult) (pediatric): Secondary | ICD-10-CM | POA: Diagnosis not present

## 2018-08-02 HISTORY — DX: Headache, unspecified: R51.9

## 2018-08-02 HISTORY — DX: Obstructive sleep apnea (adult) (pediatric): G47.33

## 2018-08-02 HISTORY — DX: Psoriasis, unspecified: L40.9

## 2018-08-02 HISTORY — DX: Headache: R51

## 2018-08-02 LAB — CBC
HCT: 41.9 % (ref 36.0–46.0)
Hemoglobin: 13.6 g/dL (ref 12.0–15.0)
MCH: 31.6 pg (ref 26.0–34.0)
MCHC: 32.5 g/dL (ref 30.0–36.0)
MCV: 97.2 fL (ref 78.0–100.0)
Platelets: 223 10*3/uL (ref 150–400)
RBC: 4.31 MIL/uL (ref 3.87–5.11)
RDW: 13.2 % (ref 11.5–15.5)
WBC: 6.7 10*3/uL (ref 4.0–10.5)

## 2018-08-02 LAB — BASIC METABOLIC PANEL
ANION GAP: 9 (ref 5–15)
BUN: 17 mg/dL (ref 8–23)
CO2: 25 mmol/L (ref 22–32)
Calcium: 9.1 mg/dL (ref 8.9–10.3)
Chloride: 104 mmol/L (ref 98–111)
Creatinine, Ser: 0.62 mg/dL (ref 0.44–1.00)
Glucose, Bld: 97 mg/dL (ref 70–99)
Potassium: 3.8 mmol/L (ref 3.5–5.1)
SODIUM: 138 mmol/L (ref 135–145)

## 2018-08-02 LAB — SURGICAL PCR SCREEN
MRSA, PCR: NEGATIVE
STAPHYLOCOCCUS AUREUS: NEGATIVE

## 2018-08-02 NOTE — Progress Notes (Signed)
PCP: Pantelis Elgersma [facility]) Requesting the following documents from Dr. Ammie Ferrier Office: sleep study, Echo, latest notes  Cardiologist: denies  DM: denies  Sleep Apnea: yes - recently diagnosed.  Waiting on CPAP to arrive  Pt denies SOB, chest pain, cough, fever  Pt states understanding of pre-op instructions.  Pt to follow up with Rheumatologist on when to stop taking Methotrexate.

## 2018-08-02 NOTE — Pre-Procedure Instructions (Signed)
Melissa Hunt [facility]  08/02/2018      [id number] [address], [place] AT [address] [place] [address] Phone: [phone] Fax: [phone]    Your procedure is scheduled on Oct. 2  Report to [facility] Admitting at 6:30  A.M.  Call this number if you have problems the morning of surgery:  [phone]   Remember:  Do not eat or drink after midnight.      Take these medicines the morning of surgery with A SIP OF WATER :               Tylenol if needed              escitalopram (lexapro)              Levothyroxine (synthroid)               7 days prior to surgery STOP taking any Aspirin(unless otherwise instructed by your surgeon), mobic, Aleve, Naproxen, Ibuprofen, Motrin, Advil, Goody's, BC's, all herbal medications, fish oil, and all vitamins          Follow your surgeon's instructions on when to stop Asprin.  If no instructions were given by your surgeon then you will need to call the office to get those instructions.                    Do not wear jewelry, make-up or nail polish.  Do not wear lotions, powders, or perfumes, or deodorant.  Do not shave 48 hours prior to surgery.  Men may shave face and neck.  Do not bring valuables to the hospital.  [facility] is not responsible for any belongings or valuables.  Contacts, dentures or bridgework may not be worn into surgery.  Leave your suitcase in the car.  After surgery it may be brought to your room.  For patients admitted to the hospital, discharge time will be determined by your treatment team.  Patients discharged the day of surgery will not be allowed to drive home.    Special instructions:  [place]- Preparing For Surgery  Before surgery, you can play an important role. Because skin is not sterile, your skin needs to be as free of germs as possible. You can reduce the number of germs on your skin by washing with CHG  (chlorahexidine gluconate) Soap before surgery.  CHG is an antiseptic cleaner which kills germs and bonds with the skin to continue killing germs even after washing.    Oral Hygiene is also important to reduce your risk of infection.  Remember - BRUSH YOUR TEETH THE MORNING OF SURGERY WITH YOUR REGULAR TOOTHPASTE  Please do not use if you have an allergy to CHG or antibacterial soaps. If your skin becomes reddened/irritated stop using the CHG.  Do not shave (including legs and underarms) for at least 48 hours prior to first CHG shower. It is OK to shave your face.  Please follow these instructions carefully.   1. Shower the NIGHT BEFORE SURGERY and the MORNING OF SURGERY with CHG.   2. If you chose to wash your hair, wash your hair first as usual with your normal shampoo.  3. After you shampoo, rinse your hair and body thoroughly to remove the shampoo.  4. Use CHG as you would any other liquid soap. You can apply CHG directly to the skin and wash gently  with a scrungie or a clean washcloth.   5. Apply the CHG Soap to your body ONLY FROM THE NECK DOWN.  Do not use on open wounds or open sores. Avoid contact with your eyes, ears, mouth and genitals (private parts). Wash Face and genitals (private parts)  with your normal soap.  6. Wash thoroughly, paying special attention to the area where your surgery will be performed.  7. Thoroughly rinse your body with warm water from the neck down.  8. DO NOT shower/wash with your normal soap after using and rinsing off the CHG Soap.  9. Pat yourself dry with a CLEAN TOWEL.  10. Wear CLEAN PAJAMAS to bed the night before surgery, wear comfortable clothes the morning of surgery  11. Place CLEAN SHEETS on your bed the night of your first shower and DO NOT SLEEP WITH PETS.    Day of Surgery:  Do not apply any deodorants/lotions.  Please wear clean clothes to the hospital/surgery center.   Remember to brush your teeth WITH YOUR REGULAR  TOOTHPASTE.    Please read over the following fact sheets that you were given. Coughing and Deep Breathing, MRSA Information and Surgical Site Infection Prevention

## 2018-08-03 ENCOUNTER — Encounter (HOSPITAL_COMMUNITY): Payer: Self-pay

## 2018-08-03 NOTE — Progress Notes (Addendum)
Anesthesia Chart Review:  Case:  [id number] Date/Time:  08/10/18 0846   Procedure:  POSTERIOR LUMBAR INTERBODY FUSION, INTERBODY PROSTHESIS, POSTERIOR FUSION INSTRUMENTATION LUMBAR 4- LUMBAR 5 (N/A ) - POSTERIOR LUMBAR INTERBODY FUSION, INTERBODY PROSTHESIS, POSTERIOR FUSION INSTRUMENTATION LUMBAR 4- LUMBAR 5   Anesthesia type:  General   Pre-op diagnosis:  SPONDYLOLISTHESIS, LUMBAR REGION   Location:  [place] OR ROOM 19 / [place] [facility]   Surgeon:  Newman Pies, MD      DISCUSSION: Patient is a 62 year old female scheduled for the above procedure. History includes never smoker, HTN, hypothyroidism, fibromyalgia, psoriasis/psoriatic arthritis, OSA (waiting on CPAP to arrive). S/p right TKA 05/02/18 and left TKA 09/06/17. BMI is consistent with obesity.   Per her PCP Dr. Sabra Heck, she had a brain MRI, sleep study, carotid U/S, and echo within the past few months for further evaluation of decreased memory, trouble with cognition over the last year. MRI showed moderate cerebellar atrophy, but no evidence of infarct. Echo was unremarkable. Carotid study showed no significant disease. Sleep study showed moderate OSA and CPAP prescribed. She has been revaluated by her PCP Dr. Sabra Heck following test results. He is aware of up-coming surgery.   She denied SOB, chest pain, cough, fever at PAT. She will reach out to her rheumatologist if any recommendations regarding perioperative methotrexate instructions. Based on currently available information, I would anticipate that she can proceed as planned if no acute changes.    VS: BP (!) 113/50   Pulse 76   Temp 37 C   Resp 20   Ht 5\' 3"  (1.6 m)   Wt 99.8 kg   SpO2 95%   BMI 38.97 kg/m     PROVIDERS: Rusty Aus, MD is PCP Jefm Bryant; [place]). Last visit 07/15/18. He is aware of upcoming back surgery.  Marlowe Sax, MD is rheumatologist Jefm Bryant, [place]). Last visit 07/05/18. She is aware of upcoming back surgery.    LABS:  Labs reviewed: Acceptable for surgery. (all labs ordered are listed, but only abnormal results are displayed)  Labs Reviewed  SURGICAL PCR SCREEN  BASIC METABOLIC PANEL  CBC  TYPE AND SCREEN    IMAGES: MRI brain 06/06/18: IMPRESSION: - Mild ventriculomegaly, probably normal for age. No acute intracranial findings. No abnormal postcontrast enhancement. Moderate cerebellar hemisphere atrophy. - No structural abnormality is seen as a cause for the reported symptoms of trouble finding words, and recognizing objects.   Sleep Study:  Home Sleep Test 07/13/18 (copy from [facility]): Findings are consistent with moderate, non-positional OSA. (According to 07/18/18 notation by Dr. Sabra Heck (Care Everywhere), recent sleep study "showed AHI 14, RDI 26, consistent with mild sleep apnea, a CPAP could help memory and energy, very worthwhile to pursue it. Needs auto CPAP 5-20 cmH2O, please set up.")    EKG:  - 07/28/18 ([facility]): NSR, minimal voltage criteria for LVH, may be normal variant.  - 04/12/18: NSR, minimal voltage criteria for LVH, may be normal variant.   CV: Echo 06/09/18 ([place]): INTERPRETATION NORMAL LEFT VENTRICULAR SYSTOLIC FUNCTION. LVEF 55% NORMAL RIGHT VENTRICULAR SYSTOLIC FUNCTION MILD VALVULAR REGURGITATION (Trivial AR, MR, PR; Mild TR) NO VALVULAR STENOSIS  Carotid U/S 06/09/18 [facility]):  Impression:  Minimal left ICA origin plaque. Estimated left ICA stenosis is < 50%. No evidence or right carotid stenosis.   Nuclear stress test 05/19/10: IMPRESSION:  1.     Normal left ventricular function.  2.     Normal wall motion.  3.  Normal Sestamibi scintigraphy without evidence for scar or ischemia.     Past Medical History:  Diagnosis Date  . Anxiety   . Arthritis 2008  . Breast discharge 2 weeks ago   LEFT BREAST BLOODY  . Essential hypertension 04/13/2018  . Fibromyalgia   . Headache   . Hypertension 2000  . Hypothyroidism   . Lump or  mass in breast   . Neoplasm of uncertain behavior of breast   . Neuromuscular disorder ([place])    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  . OSA (obstructive sleep apnea)   . Primary localized osteoarthritis of knee    Right  . Primary localized osteoarthritis of left knee 09/06/2017  . Primary localized osteoarthritis of right knee 04/13/2018  . Psoriasis   . S/P total knee arthroplasty, left 04/13/2018    Past Surgical History:  Procedure Laterality Date  . BREAST BIOPSY Left 2014   neg, but took Tamoxifen 1 1/2 yrs  . BREAST BIOPSY Right    many years ago  . BREAST BIOPSY Left 03/12/2017   FIBROCYSTIC CHANGES WITH CALCIFICATIONS.[place]  . BREAST SURGERY Left 2014   breast biopsy/Atypical ductal hyperplasia of breast   . BREAST SURGERY Right 1999   excision of mass  . CESAREAN SECTION  1985  . CHOLECYSTECTOMY  1994  . COLONOSCOPY  2013   Dr Candace Cruise  . ENDOMETRIAL ABLATION  2006  . TOTAL KNEE ARTHROPLASTY Left 09/06/2017   Procedure: TOTAL KNEE ARTHROPLASTY;  Surgeon: Elsie Saas, MD;  Location: [facility];  Service: Orthopedics;  Laterality: Left;  . TOTAL KNEE ARTHROPLASTY Right 05/02/2018   Procedure: TOTAL KNEE ARTHROPLASTY;  Surgeon: Elsie Saas, MD;  Location: [facility];  Service: Orthopedics;  Laterality: Right;    MEDICATIONS: . acetaminophen (TYLENOL) 500 MG tablet  . aspirin EC 325 MG EC tablet  . calcium elemental as carbonate (TUMS ULTRA 1000) 400 MG chewable tablet  . clobetasol cream (TEMOVATE) 0.05 %  . docusate sodium (COLACE) 100 MG capsule  . escitalopram (LEXAPRO) 10 MG tablet  . folic acid (FOLVITE) 1 MG tablet  . gabapentin (NEURONTIN) 100 MG capsule  . levothyroxine (SYNTHROID, LEVOTHROID) 150 MCG tablet  . meloxicam (MOBIC) 7.5 MG tablet  . methotrexate (RHEUMATREX) 2.5 MG tablet  . olmesartan (BENICAR) 20 MG tablet  . oxyCODONE (OXY IR/ROXICODONE) 5 MG immediate release tablet  . polyethylene glycol (MIRALAX / GLYCOLAX) packet  . temazepam (RESTORIL) 15 MG capsule  .  traMADol (ULTRAM) 50 MG tablet   No current facility-administered medications for this encounter.   Per PAT RN instructions, she was advised to hold ASA for 7 days prior to surgery unless otherwise instructed by her surgeon.    [facility] Short Stay Center/Anesthesiology Phone [phone] 08/05/2018 12:11 PM

## 2018-08-09 NOTE — Anesthesia Preprocedure Evaluation (Addendum)
Anesthesia Evaluation  Patient identified by MRN, date of birth, ID band Patient awake    Reviewed: Allergy & Precautions, H&P , NPO status , Patient's Chart, lab work & pertinent test results  Airway Mallampati: II  TM Distance: >3 FB Neck ROM: Full    Dental  (+) Teeth Intact   Pulmonary sleep apnea ,    Pulmonary exam normal breath sounds clear to auscultation       Cardiovascular hypertension, Pt. on medications Normal cardiovascular exam Rhythm:Regular Rate:Normal     Neuro/Psych Anxiety negative neurological ROS     GI/Hepatic negative GI ROS, Neg liver ROS,   Endo/Other  Hypothyroidism   Renal/GU negative Renal ROS     Musculoskeletal  (+) Arthritis , Fibromyalgia -  Abdominal (+) + obese,   Peds  Hematology negative hematology ROS (+)   Anesthesia Other Findings   Reproductive/Obstetrics                             Lab Results  Component Value Date   WBC 6.7 08/02/2018   HGB 13.6 08/02/2018   HCT 41.9 08/02/2018   MCV 97.2 08/02/2018   PLT 223 08/02/2018   Lab Results  Component Value Date   CREATININE 0.62 08/02/2018   BUN 17 08/02/2018   NA 138 08/02/2018   K 3.8 08/02/2018   CL 104 08/02/2018   CO2 25 08/02/2018    Anesthesia Physical  Anesthesia Plan  ASA: II  Anesthesia Plan: General   Post-op Pain Management:    Induction: Intravenous  PONV Risk Score and Plan: 2 and Ondansetron, Dexamethasone and Treatment may vary due to age or medical condition  Airway Management Planned: Oral ETT  Additional Equipment:   Intra-op Plan:   Post-operative Plan: Extubation in OR  Informed Consent: I have reviewed the patients History and Physical, chart, labs and discussed the procedure including the risks, benefits and alternatives for the proposed anesthesia with the patient or authorized representative who has indicated his/her understanding and acceptance.    Dental Advisory Given  Plan Discussed with: CRNA and Surgeon  Anesthesia Plan Comments: (  )        Anesthesia Quick Evaluation

## 2018-08-10 ENCOUNTER — Encounter (HOSPITAL_COMMUNITY): Payer: Self-pay

## 2018-08-10 ENCOUNTER — Inpatient Hospital Stay (HOSPITAL_COMMUNITY): Payer: Managed Care, Other (non HMO)

## 2018-08-10 ENCOUNTER — Encounter (HOSPITAL_COMMUNITY)
Admission: RE | Disposition: A | Discharge status: Home / Self Care | Payer: Self-pay | Source: Home / Self Care | Attending: Neurosurgery

## 2018-08-10 ENCOUNTER — Other Ambulatory Visit: Payer: Self-pay

## 2018-08-10 ENCOUNTER — Inpatient Hospital Stay (HOSPITAL_COMMUNITY): Payer: Managed Care, Other (non HMO) | Admitting: Vascular Surgery

## 2018-08-10 ENCOUNTER — Inpatient Hospital Stay (HOSPITAL_COMMUNITY)
Admission: RE | Admit: 2018-08-10 | Discharge: 2018-08-13 | DRG: 454 | Disposition: A | Discharge status: Home / Self Care | Payer: Managed Care, Other (non HMO) | Attending: Neurosurgery | Admitting: Neurosurgery

## 2018-08-10 ENCOUNTER — Inpatient Hospital Stay (HOSPITAL_COMMUNITY): Payer: Managed Care, Other (non HMO) | Admitting: Anesthesiology

## 2018-08-10 DIAGNOSIS — Z6838 Body mass index (BMI) 38.0-38.9, adult: Secondary | ICD-10-CM | POA: Diagnosis not present

## 2018-08-10 DIAGNOSIS — E039 Hypothyroidism, unspecified: Secondary | ICD-10-CM | POA: Diagnosis present

## 2018-08-10 DIAGNOSIS — Z79899 Other long term (current) drug therapy: Secondary | ICD-10-CM | POA: Diagnosis not present

## 2018-08-10 DIAGNOSIS — F419 Anxiety disorder, unspecified: Secondary | ICD-10-CM | POA: Diagnosis present

## 2018-08-10 DIAGNOSIS — I10 Essential (primary) hypertension: Secondary | ICD-10-CM | POA: Diagnosis present

## 2018-08-10 DIAGNOSIS — E669 Obesity, unspecified: Secondary | ICD-10-CM | POA: Diagnosis present

## 2018-08-10 DIAGNOSIS — Z9049 Acquired absence of other specified parts of digestive tract: Secondary | ICD-10-CM | POA: Diagnosis not present

## 2018-08-10 DIAGNOSIS — M4696 Unspecified inflammatory spondylopathy, lumbar region: Secondary | ICD-10-CM | POA: Diagnosis present

## 2018-08-10 DIAGNOSIS — G4733 Obstructive sleep apnea (adult) (pediatric): Secondary | ICD-10-CM | POA: Diagnosis present

## 2018-08-10 DIAGNOSIS — Z419 Encounter for procedure for purposes other than remedying health state, unspecified: Secondary | ICD-10-CM

## 2018-08-10 DIAGNOSIS — M1990 Unspecified osteoarthritis, unspecified site: Secondary | ICD-10-CM | POA: Diagnosis present

## 2018-08-10 DIAGNOSIS — Z96653 Presence of artificial knee joint, bilateral: Secondary | ICD-10-CM | POA: Diagnosis present

## 2018-08-10 DIAGNOSIS — M549 Dorsalgia, unspecified: Secondary | ICD-10-CM | POA: Diagnosis present

## 2018-08-10 DIAGNOSIS — D62 Acute posthemorrhagic anemia: Secondary | ICD-10-CM | POA: Diagnosis not present

## 2018-08-10 DIAGNOSIS — Z791 Long term (current) use of non-steroidal anti-inflammatories (NSAID): Secondary | ICD-10-CM | POA: Diagnosis not present

## 2018-08-10 DIAGNOSIS — Z79890 Hormone replacement therapy (postmenopausal): Secondary | ICD-10-CM | POA: Diagnosis not present

## 2018-08-10 DIAGNOSIS — M797 Fibromyalgia: Secondary | ICD-10-CM | POA: Diagnosis present

## 2018-08-10 DIAGNOSIS — M4316 Spondylolisthesis, lumbar region: Secondary | ICD-10-CM | POA: Diagnosis present

## 2018-08-10 DIAGNOSIS — M48061 Spinal stenosis, lumbar region without neurogenic claudication: Principal | ICD-10-CM | POA: Diagnosis present

## 2018-08-10 DIAGNOSIS — L409 Psoriasis, unspecified: Secondary | ICD-10-CM | POA: Diagnosis present

## 2018-08-10 DIAGNOSIS — M5116 Intervertebral disc disorders with radiculopathy, lumbar region: Secondary | ICD-10-CM | POA: Diagnosis present

## 2018-08-10 LAB — TYPE AND SCREEN
ABO/RH(D): A NEG
ABO/RH(D): A NEG
Antibody Screen: NEGATIVE
Antibody Screen: NEGATIVE

## 2018-08-10 SURGERY — POSTERIOR LUMBAR FUSION 1 LEVEL
Anesthesia: General

## 2018-08-10 MED ORDER — ONDANSETRON HCL 4 MG/2ML IJ SOLN
4.0000 mg | Freq: Four times a day (QID) | INTRAMUSCULAR | Status: DC | PRN
  Administered 2018-08-10: 4 mg via INTRAVENOUS
  Filled 2018-08-10: qty 2

## 2018-08-10 MED ORDER — LEVOTHYROXINE SODIUM 75 MCG PO TABS
150.0000 ug | ORAL_TABLET | Freq: Every day | ORAL | Status: DC
  Administered 2018-08-11 – 2018-08-13 (×3): 150 ug via ORAL
  Filled 2018-08-10 (×4): qty 2

## 2018-08-10 MED ORDER — BUPIVACAINE-EPINEPHRINE 0.5% -1:200000 IJ SOLN
INTRAMUSCULAR | Status: AC
  Filled 2018-08-10: qty 1

## 2018-08-10 MED ORDER — ZOLPIDEM TARTRATE 5 MG PO TABS: 5.0000 mg | ORAL_TABLET | Freq: Every evening | ORAL | Status: DC | PRN

## 2018-08-10 MED ORDER — CYCLOBENZAPRINE HCL 10 MG PO TABS
10.0000 mg | ORAL_TABLET | Freq: Three times a day (TID) | ORAL | Status: DC | PRN
  Administered 2018-08-10 – 2018-08-12 (×5): 10 mg via ORAL
  Filled 2018-08-10 (×4): qty 1

## 2018-08-10 MED ORDER — SODIUM CHLORIDE 0.9 % IV SOLN
INTRAVENOUS | Status: DC | PRN
  Administered 2018-08-10: 20 ug/min via INTRAVENOUS

## 2018-08-10 MED ORDER — ACETAMINOPHEN 325 MG PO TABS
650.0000 mg | ORAL_TABLET | ORAL | Status: DC | PRN
  Administered 2018-08-11: 650 mg via ORAL
  Administered 2018-08-11: 325 mg via ORAL
  Administered 2018-08-12 (×2): 650 mg via ORAL
  Filled 2018-08-10 (×5): qty 2

## 2018-08-10 MED ORDER — HYDROMORPHONE HCL 1 MG/ML IJ SOLN
INTRAMUSCULAR | Status: AC
  Filled 2018-08-10: qty 1

## 2018-08-10 MED ORDER — SODIUM CHLORIDE 0.9% FLUSH: 3.0000 mL | INTRAVENOUS | Status: DC | PRN

## 2018-08-10 MED ORDER — ACETAMINOPHEN 500 MG PO TABS
1000.0000 mg | ORAL_TABLET | Freq: Four times a day (QID) | ORAL | Status: AC
  Administered 2018-08-10 – 2018-08-11 (×4): 1000 mg via ORAL
  Filled 2018-08-10 (×4): qty 2

## 2018-08-10 MED ORDER — ESCITALOPRAM OXALATE 10 MG PO TABS
10.0000 mg | ORAL_TABLET | Freq: Every day | ORAL | Status: DC
  Administered 2018-08-11 – 2018-08-13 (×3): 10 mg via ORAL
  Filled 2018-08-10 (×3): qty 1

## 2018-08-10 MED ORDER — 0.9 % SODIUM CHLORIDE (POUR BTL) OPTIME
TOPICAL | Status: DC | PRN
  Administered 2018-08-10: 1000 mL

## 2018-08-10 MED ORDER — BUPIVACAINE-EPINEPHRINE (PF) 0.5% -1:200000 IJ SOLN
INTRAMUSCULAR | Status: DC | PRN
  Administered 2018-08-10: 10 mL via PERINEURAL

## 2018-08-10 MED ORDER — SUGAMMADEX SODIUM 200 MG/2ML IV SOLN
INTRAVENOUS | Status: DC | PRN
  Administered 2018-08-10: 200 mg via INTRAVENOUS

## 2018-08-10 MED ORDER — PROPOFOL 10 MG/ML IV BOLUS
INTRAVENOUS | Status: DC | PRN
  Administered 2018-08-10: 20 mg via INTRAVENOUS
  Administered 2018-08-10: 30 mg via INTRAVENOUS
  Administered 2018-08-10: 150 mg via INTRAVENOUS

## 2018-08-10 MED ORDER — THROMBIN 5000 UNITS EX SOLR
CUTANEOUS | Status: AC
  Filled 2018-08-10: qty 5000

## 2018-08-10 MED ORDER — FENTANYL CITRATE (PF) 250 MCG/5ML IJ SOLN
INTRAMUSCULAR | Status: AC
  Filled 2018-08-10: qty 5

## 2018-08-10 MED ORDER — CHLORHEXIDINE GLUCONATE CLOTH 2 % EX PADS: 6.0000 | MEDICATED_PAD | Freq: Once | CUTANEOUS | Status: DC

## 2018-08-10 MED ORDER — INFLUENZA VAC SPLIT QUAD 0.5 ML IM SUSY: 0.5000 mL | PREFILLED_SYRINGE | INTRAMUSCULAR | Status: DC

## 2018-08-10 MED ORDER — MIDAZOLAM HCL 2 MG/2ML IJ SOLN
INTRAMUSCULAR | Status: AC
  Filled 2018-08-10: qty 2

## 2018-08-10 MED ORDER — ROCURONIUM BROMIDE 10 MG/ML (PF) SYRINGE
PREFILLED_SYRINGE | INTRAVENOUS | Status: DC | PRN
  Administered 2018-08-10: 10 mg via INTRAVENOUS
  Administered 2018-08-10: 40 mg via INTRAVENOUS
  Administered 2018-08-10: 10 mg via INTRAVENOUS
  Administered 2018-08-10: 20 mg via INTRAVENOUS
  Administered 2018-08-10: 10 mg via INTRAVENOUS

## 2018-08-10 MED ORDER — ONDANSETRON HCL 4 MG/2ML IJ SOLN
INTRAMUSCULAR | Status: AC
  Filled 2018-08-10: qty 2

## 2018-08-10 MED ORDER — OXYCODONE HCL 5 MG PO TABS
5.0000 mg | ORAL_TABLET | ORAL | Status: DC | PRN
  Administered 2018-08-11 – 2018-08-13 (×5): 5 mg via ORAL
  Filled 2018-08-10 (×2): qty 1

## 2018-08-10 MED ORDER — ACETAMINOPHEN 650 MG RE SUPP: 650.0000 mg | RECTAL | Status: DC | PRN

## 2018-08-10 MED ORDER — SODIUM CHLORIDE 0.9% FLUSH
3.0000 mL | Freq: Two times a day (BID) | INTRAVENOUS | Status: DC
  Administered 2018-08-11: 3 mL via INTRAVENOUS

## 2018-08-10 MED ORDER — THROMBIN (RECOMBINANT) 20000 UNITS EX SOLR
CUTANEOUS | Status: AC
  Filled 2018-08-10: qty 20000

## 2018-08-10 MED ORDER — LIDOCAINE 2% (20 MG/ML) 5 ML SYRINGE
INTRAMUSCULAR | Status: AC
  Filled 2018-08-10: qty 5

## 2018-08-10 MED ORDER — MENTHOL 3 MG MT LOZG: 1.0000 | LOZENGE | OROMUCOSAL | Status: DC | PRN

## 2018-08-10 MED ORDER — DOCUSATE SODIUM 100 MG PO CAPS
100.0000 mg | ORAL_CAPSULE | Freq: Two times a day (BID) | ORAL | Status: DC
  Administered 2018-08-10 – 2018-08-13 (×6): 100 mg via ORAL
  Filled 2018-08-10 (×7): qty 1

## 2018-08-10 MED ORDER — LACTATED RINGERS IV SOLN
INTRAVENOUS | Status: DC
  Administered 2018-08-10 (×2): via INTRAVENOUS

## 2018-08-10 MED ORDER — BISACODYL 10 MG RE SUPP: 10.0000 mg | Freq: Every day | RECTAL | Status: DC | PRN

## 2018-08-10 MED ORDER — HYDROMORPHONE HCL 1 MG/ML IJ SOLN
INTRAMUSCULAR | Status: AC
  Administered 2018-08-10: 0.5 mg via INTRAVENOUS
  Filled 2018-08-10: qty 1

## 2018-08-10 MED ORDER — BUPIVACAINE HCL (PF) 0.5 % IJ SOLN
INTRAMUSCULAR | Status: AC
  Filled 2018-08-10: qty 10

## 2018-08-10 MED ORDER — ONDANSETRON HCL 4 MG/2ML IJ SOLN
INTRAMUSCULAR | Status: DC | PRN
  Administered 2018-08-10: 4 mg via INTRAVENOUS

## 2018-08-10 MED ORDER — ONDANSETRON HCL 4 MG PO TABS: 4.0000 mg | ORAL_TABLET | Freq: Four times a day (QID) | ORAL | Status: DC | PRN

## 2018-08-10 MED ORDER — PROPOFOL 10 MG/ML IV BOLUS
INTRAVENOUS | Status: AC
  Filled 2018-08-10: qty 20

## 2018-08-10 MED ORDER — ROCURONIUM BROMIDE 50 MG/5ML IV SOSY
PREFILLED_SYRINGE | INTRAVENOUS | Status: AC
  Filled 2018-08-10: qty 5

## 2018-08-10 MED ORDER — CYCLOBENZAPRINE HCL 10 MG PO TABS
ORAL_TABLET | ORAL | Status: AC
  Filled 2018-08-10: qty 1

## 2018-08-10 MED ORDER — FENTANYL CITRATE (PF) 100 MCG/2ML IJ SOLN
INTRAMUSCULAR | Status: AC
  Administered 2018-08-10: 50 ug via INTRAVENOUS
  Filled 2018-08-10: qty 2

## 2018-08-10 MED ORDER — CEFAZOLIN SODIUM-DEXTROSE 2-4 GM/100ML-% IV SOLN
2.0000 g | INTRAVENOUS | Status: AC
  Administered 2018-08-10: 2 g via INTRAVENOUS
  Filled 2018-08-10: qty 100

## 2018-08-10 MED ORDER — THROMBIN 5000 UNITS EX SOLR
OROMUCOSAL | Status: DC | PRN
  Administered 2018-08-10: 09:00:00 via TOPICAL

## 2018-08-10 MED ORDER — ONDANSETRON HCL 4 MG/2ML IJ SOLN: 4.0000 mg | Freq: Once | INTRAMUSCULAR | Status: DC | PRN

## 2018-08-10 MED ORDER — OXYCODONE HCL 5 MG PO TABS
ORAL_TABLET | ORAL | Status: AC
  Filled 2018-08-10: qty 2

## 2018-08-10 MED ORDER — PHENOL 1.4 % MT LIQD: 1.0000 | OROMUCOSAL | Status: DC | PRN

## 2018-08-10 MED ORDER — CEFAZOLIN SODIUM-DEXTROSE 2-4 GM/100ML-% IV SOLN
2.0000 g | Freq: Three times a day (TID) | INTRAVENOUS | Status: AC
  Administered 2018-08-10 (×2): 2 g via INTRAVENOUS
  Filled 2018-08-10 (×2): qty 100

## 2018-08-10 MED ORDER — BACITRACIN ZINC 500 UNIT/GM EX OINT
TOPICAL_OINTMENT | CUTANEOUS | Status: AC
  Filled 2018-08-10: qty 28.35

## 2018-08-10 MED ORDER — IRBESARTAN 150 MG PO TABS
150.0000 mg | ORAL_TABLET | Freq: Every day | ORAL | Status: DC
  Administered 2018-08-10 – 2018-08-13 (×3): 150 mg via ORAL
  Filled 2018-08-10 (×4): qty 1

## 2018-08-10 MED ORDER — FENTANYL CITRATE (PF) 100 MCG/2ML IJ SOLN
25.0000 ug | INTRAMUSCULAR | Status: DC | PRN
  Administered 2018-08-10 (×3): 50 ug via INTRAVENOUS

## 2018-08-10 MED ORDER — HYDROMORPHONE HCL 1 MG/ML IJ SOLN
0.2500 mg | INTRAMUSCULAR | Status: DC | PRN
  Administered 2018-08-10 (×3): 0.5 mg via INTRAVENOUS

## 2018-08-10 MED ORDER — THROMBIN 20000 UNITS EX SOLR
CUTANEOUS | Status: DC | PRN
  Administered 2018-08-10: 09:00:00 via TOPICAL

## 2018-08-10 MED ORDER — LIDOCAINE 2% (20 MG/ML) 5 ML SYRINGE
INTRAMUSCULAR | Status: DC | PRN
  Administered 2018-08-10: 80 mg via INTRAVENOUS

## 2018-08-10 MED ORDER — GABAPENTIN 100 MG PO CAPS
100.0000 mg | ORAL_CAPSULE | Freq: Every day | ORAL | Status: DC
  Administered 2018-08-10 – 2018-08-12 (×3): 100 mg via ORAL
  Filled 2018-08-10 (×3): qty 1

## 2018-08-10 MED ORDER — FOLIC ACID 1 MG PO TABS
1.0000 mg | ORAL_TABLET | Freq: Every day | ORAL | Status: DC
  Administered 2018-08-10 – 2018-08-13 (×4): 1 mg via ORAL
  Filled 2018-08-10 (×4): qty 1

## 2018-08-10 MED ORDER — MIDAZOLAM HCL 2 MG/2ML IJ SOLN
INTRAMUSCULAR | Status: DC | PRN
  Administered 2018-08-10: 2 mg via INTRAVENOUS

## 2018-08-10 MED ORDER — BUPIVACAINE LIPOSOME 1.3 % IJ SUSP
20.0000 mL | INTRAMUSCULAR | Status: AC
  Administered 2018-08-10: 20 mL
  Filled 2018-08-10: qty 20

## 2018-08-10 MED ORDER — OXYCODONE HCL 5 MG PO TABS
10.0000 mg | ORAL_TABLET | ORAL | Status: DC | PRN
  Administered 2018-08-10 – 2018-08-13 (×12): 10 mg via ORAL
  Filled 2018-08-10 (×14): qty 2

## 2018-08-10 MED ORDER — ALBUMIN HUMAN 5 % IV SOLN
INTRAVENOUS | Status: DC | PRN
  Administered 2018-08-10: 10:00:00 via INTRAVENOUS

## 2018-08-10 MED ORDER — BACITRACIN ZINC 500 UNIT/GM EX OINT
TOPICAL_OINTMENT | CUTANEOUS | Status: DC | PRN
  Administered 2018-08-10: 1 via TOPICAL

## 2018-08-10 MED ORDER — DEXAMETHASONE SODIUM PHOSPHATE 10 MG/ML IJ SOLN
INTRAMUSCULAR | Status: AC
  Filled 2018-08-10: qty 1

## 2018-08-10 MED ORDER — DEXAMETHASONE SODIUM PHOSPHATE 10 MG/ML IJ SOLN
INTRAMUSCULAR | Status: DC | PRN
  Administered 2018-08-10: 10 mg via INTRAVENOUS

## 2018-08-10 MED ORDER — SODIUM CHLORIDE 0.9 % IV SOLN
INTRAVENOUS | Status: DC | PRN
  Administered 2018-08-10: 09:00:00

## 2018-08-10 MED ORDER — VANCOMYCIN HCL 1000 MG IV SOLR
INTRAVENOUS | Status: AC
  Filled 2018-08-10: qty 1000

## 2018-08-10 MED ORDER — MORPHINE SULFATE (PF) 4 MG/ML IV SOLN
4.0000 mg | INTRAVENOUS | Status: DC | PRN
  Administered 2018-08-10: 4 mg via INTRAVENOUS
  Filled 2018-08-10: qty 1

## 2018-08-10 MED ORDER — FENTANYL CITRATE (PF) 100 MCG/2ML IJ SOLN
INTRAMUSCULAR | Status: DC | PRN
  Administered 2018-08-10 (×2): 50 ug via INTRAVENOUS
  Administered 2018-08-10: 100 ug via INTRAVENOUS

## 2018-08-10 SURGICAL SUPPLY — 80 items
BAG DECANTER FOR FLEXI CONT (MISCELLANEOUS) ×3 IMPLANT
BENZOIN TINCTURE PRP APPL 2/3 (GAUZE/BANDAGES/DRESSINGS) ×3 IMPLANT
BLADE CLIPPER SURG (BLADE) IMPLANT
BUR MATCHSTICK NEURO 3.0 LAGG (BURR) ×3 IMPLANT
BUR PRECISION FLUTE 6.0 (BURR) ×3 IMPLANT
CANISTER SUCT 3000ML PPV (MISCELLANEOUS) ×3 IMPLANT
CAP REVERE LOCKING (Cap) ×12 IMPLANT
CARTRIDGE OIL MAESTRO DRILL (MISCELLANEOUS) ×1 IMPLANT
CLOSURE WOUND 1/2 X4 (GAUZE/BANDAGES/DRESSINGS) ×1
CONT SPEC 4OZ CLIKSEAL STRL BL (MISCELLANEOUS) ×3 IMPLANT
COVER BACK TABLE 60X90IN (DRAPES) ×3 IMPLANT
COVER WAND RF STERILE (DRAPES) IMPLANT
DECANTER SPIKE VIAL GLASS SM (MISCELLANEOUS) ×3 IMPLANT
DERMABOND ADVANCED (GAUZE/BANDAGES/DRESSINGS) ×2
DERMABOND ADVANCED .7 DNX12 (GAUZE/BANDAGES/DRESSINGS) ×1 IMPLANT
DIFFUSER DRILL AIR PNEUMATIC (MISCELLANEOUS) ×3 IMPLANT
DRAPE C-ARM 42X72 X-RAY (DRAPES) ×6 IMPLANT
DRAPE HALF SHEET 40X57 (DRAPES) ×3 IMPLANT
DRAPE LAPAROTOMY 100X72X124 (DRAPES) ×3 IMPLANT
DRAPE SURG 17X23 STRL (DRAPES) ×12 IMPLANT
DRSG OPSITE POSTOP 4X6 (GAUZE/BANDAGES/DRESSINGS) ×3 IMPLANT
ELECT BLADE 4.0 EZ CLEAN MEGAD (MISCELLANEOUS) ×3
ELECT REM PT RETURN 9FT ADLT (ELECTROSURGICAL) ×3
ELECTRODE BLDE 4.0 EZ CLN MEGD (MISCELLANEOUS) ×1 IMPLANT
ELECTRODE REM PT RTRN 9FT ADLT (ELECTROSURGICAL) ×1 IMPLANT
EVACUATOR 1/8 PVC DRAIN (DRAIN) IMPLANT
GAUZE 4X4 16PLY RFD (DISPOSABLE) ×3 IMPLANT
GAUZE SPONGE 4X4 12PLY STRL (GAUZE/BANDAGES/DRESSINGS) ×3 IMPLANT
GLOVE BIO SURGEON STRL SZ8 (GLOVE) ×6 IMPLANT
GLOVE BIO SURGEON STRL SZ8.5 (GLOVE) ×6 IMPLANT
GLOVE BIOGEL PI IND STRL 6.5 (GLOVE) ×1 IMPLANT
GLOVE BIOGEL PI IND STRL 7.0 (GLOVE) ×1 IMPLANT
GLOVE BIOGEL PI IND STRL 7.5 (GLOVE) ×1 IMPLANT
GLOVE BIOGEL PI IND STRL 8 (GLOVE) ×1 IMPLANT
GLOVE BIOGEL PI INDICATOR 6.5 (GLOVE) ×2
GLOVE BIOGEL PI INDICATOR 7.0 (GLOVE) ×2
GLOVE BIOGEL PI INDICATOR 7.5 (GLOVE) ×2
GLOVE BIOGEL PI INDICATOR 8 (GLOVE) ×2
GLOVE ECLIPSE 6.5 STRL STRAW (GLOVE) ×3 IMPLANT
GLOVE ECLIPSE 7.5 STRL STRAW (GLOVE) ×6 IMPLANT
GLOVE EXAM NITRILE LRG STRL (GLOVE) IMPLANT
GLOVE EXAM NITRILE XL STR (GLOVE) IMPLANT
GLOVE EXAM NITRILE XS STR PU (GLOVE) IMPLANT
GLOVE SURG SS PI 6.5 STRL IVOR (GLOVE) ×3 IMPLANT
GLOVE SURG SS PI 7.0 STRL IVOR (GLOVE) ×9 IMPLANT
GOWN STRL REUS W/ TWL LRG LVL3 (GOWN DISPOSABLE) ×4 IMPLANT
GOWN STRL REUS W/ TWL XL LVL3 (GOWN DISPOSABLE) ×3 IMPLANT
GOWN STRL REUS W/TWL 2XL LVL3 (GOWN DISPOSABLE) IMPLANT
GOWN STRL REUS W/TWL LRG LVL3 (GOWN DISPOSABLE) ×8
GOWN STRL REUS W/TWL XL LVL3 (GOWN DISPOSABLE) ×6
HEMOSTAT POWDER KIT SURGIFOAM (HEMOSTASIS) ×3 IMPLANT
KIT BASIN OR (CUSTOM PROCEDURE TRAY) ×3 IMPLANT
KIT TURNOVER KIT B (KITS) ×3 IMPLANT
MILL MEDIUM DISP (BLADE) ×3 IMPLANT
NEEDLE HYPO 21X1.5 SAFETY (NEEDLE) IMPLANT
NEEDLE HYPO 22GX1.5 SAFETY (NEEDLE) ×3 IMPLANT
NS IRRIG 1000ML POUR BTL (IV SOLUTION) ×3 IMPLANT
OIL CARTRIDGE MAESTRO DRILL (MISCELLANEOUS) ×3
PACK LAMINECTOMY NEURO (CUSTOM PROCEDURE TRAY) ×3 IMPLANT
PAD ARMBOARD 7.5X6 YLW CONV (MISCELLANEOUS) ×9 IMPLANT
PATTIES SURGICAL .5 X1 (DISPOSABLE) IMPLANT
PATTIES SURGICAL 1X1 (DISPOSABLE) ×3 IMPLANT
ROD REVERE 6.35 40MM (Rod) ×6 IMPLANT
SCREW 7.5X45MM (Screw) ×6 IMPLANT
SCREW 7.5X50MM (Screw) ×3 IMPLANT
SCREW REVERE 6.35 7.5X40 (Screw) ×3 IMPLANT
SPACER ALTERA 10X31 8-12MM-8 (Spacer) ×3 IMPLANT
SPONGE LAP 4X18 RFD (DISPOSABLE) IMPLANT
SPONGE NEURO XRAY DETECT 1X3 (DISPOSABLE) IMPLANT
SPONGE SURGIFOAM ABS GEL 100 (HEMOSTASIS) ×3 IMPLANT
STRIP BIOACTIVE 10CC 25X50X8 (Miscellaneous) ×3 IMPLANT
STRIP CLOSURE SKIN 1/2X4 (GAUZE/BANDAGES/DRESSINGS) ×2 IMPLANT
SUT VIC AB 1 CT1 18XBRD ANBCTR (SUTURE) ×2 IMPLANT
SUT VIC AB 1 CT1 8-18 (SUTURE) ×4
SUT VIC AB 2-0 CP2 18 (SUTURE) ×9 IMPLANT
SYR 20CC LL (SYRINGE) ×3 IMPLANT
TOWEL GREEN STERILE (TOWEL DISPOSABLE) ×3 IMPLANT
TOWEL GREEN STERILE FF (TOWEL DISPOSABLE) ×3 IMPLANT
TRAY FOLEY MTR SLVR 16FR STAT (SET/KITS/TRAYS/PACK) ×3 IMPLANT
WATER STERILE IRR 1000ML POUR (IV SOLUTION) ×3 IMPLANT

## 2018-08-10 NOTE — H&P (Signed)
Subjective: The patient is a 62 year old white female who has complained of back and leg pain consistent with lumbar radiculopathy/neurogenic claudication.  She has failed medical management and was worked up with a lumbar MRI and lumbar x-rays.  This demonstrated a L4-5 spinal listhesis and spinal stenosis.  I discussed the various treatment options with the patient.  She has decided to proceed with surgery.  Past Medical History:  Diagnosis Date  . Anxiety   . Arthritis 2008  . Breast discharge 2 weeks ago   LEFT BREAST BLOODY  . Essential hypertension 04/13/2018  . Fibromyalgia   . Headache   . Hypertension 2000  . Hypothyroidism   . Lump or mass in breast   . Neoplasm of uncertain behavior of breast   . Neuromuscular disorder ([place])    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  . OSA (obstructive sleep apnea)   . Primary localized osteoarthritis of knee    Right  . Primary localized osteoarthritis of left knee 09/06/2017  . Primary localized osteoarthritis of right knee 04/13/2018  . Psoriasis   . S/P total knee arthroplasty, left 04/13/2018    Past Surgical History:  Procedure Laterality Date  . BREAST BIOPSY Left 2014   neg, but took Tamoxifen 1 1/2 yrs  . BREAST BIOPSY Right    many years ago  . BREAST BIOPSY Left 03/12/2017   FIBROCYSTIC CHANGES WITH CALCIFICATIONS.[place]  . BREAST SURGERY Left 2014   breast biopsy/Atypical ductal hyperplasia of breast   . BREAST SURGERY Right 1999   excision of mass  . CESAREAN SECTION  1985  . CHOLECYSTECTOMY  1994  . COLONOSCOPY  2013   Dr Candace Cruise  . ENDOMETRIAL ABLATION  2006  . EYE SURGERY Bilateral    lasik eye surgery  . TOTAL KNEE ARTHROPLASTY Left 09/06/2017   Procedure: TOTAL KNEE ARTHROPLASTY;  Surgeon: Elsie Saas, MD;  Location: [place];  Service: Orthopedics;  Laterality: Left;  . TOTAL KNEE ARTHROPLASTY Right 05/02/2018   Procedure: TOTAL KNEE ARTHROPLASTY;  Surgeon: Elsie Saas, MD;  Location: [place];  Service: Orthopedics;  Laterality:  Right;    No Known Allergies  Social History   Tobacco Use  . Smoking status: Never Smoker  . Smokeless tobacco: Never Used  Substance Use Topics  . Alcohol use: Yes    Alcohol/week: 1.0 standard drinks    Types: 1 Glasses of wine per week    Comment: Occasional    Family History  Problem Relation Age of Onset  . Cancer Father 69       colon  . Cancer Paternal Aunt        liver  . Cancer Maternal Grandmother        lung  . Breast cancer Maternal Aunt   . Breast cancer Paternal Aunt    Prior to Admission medications   Medication Sig Start Date End Date Taking? Authorizing Provider  acetaminophen (TYLENOL) 500 MG tablet Take 500-1,000 mg by mouth every 6 (six) hours as needed for moderate pain or headache.   Yes [provider]  calcium elemental as carbonate (TUMS ULTRA 1000) 400 MG chewable tablet Chew 2,000 mg by mouth daily as needed for heartburn.   Yes [provider]  escitalopram (LEXAPRO) 10 MG tablet Take 10 mg by mouth daily.   Yes [provider]  folic acid (FOLVITE) 1 MG tablet Take 1 mg by mouth daily. 04/05/18  Yes [provider]  gabapentin (NEURONTIN) 100 MG capsule Take 100 mg by mouth at  bedtime.  04/09/18  Yes [provider]  levothyroxine (SYNTHROID, LEVOTHROID) 150 MCG tablet Take 150 mcg by mouth every morning. 30-60 MINS BEFORE BREAKFAST 04/01/18  Yes [provider]  meloxicam (MOBIC) 7.5 MG tablet Take 7.5 mg by mouth daily.   Yes [provider]  methotrexate (RHEUMATREX) 2.5 MG tablet Take 20 mg by mouth every Monday.  04/05/18  Yes [provider]  olmesartan (BENICAR) 20 MG tablet Take 10 mg by mouth at bedtime.   Yes [provider]  temazepam (RESTORIL) 15 MG capsule Take 15 mg by mouth at bedtime.   Yes [provider]  traMADol (ULTRAM) 50 MG tablet Take 50 mg by mouth at bedtime.   Yes [provider]  aspirin EC 325 MG EC tablet 1 tab a day for the  next 30 days to prevent blood clots Patient not taking: Reported on 07/28/2018 05/03/18   Shepperson, Kirstin, PA-C  clobetasol cream (TEMOVATE) 7.82 % Apply 1 application topically 2 (two) times daily as needed (dermatosis).     [provider]  docusate sodium (COLACE) 100 MG capsule 1 tab 2 times a day while on narcotics.  STOOL SOFTENER Patient not taking: Reported on 07/28/2018 05/03/18   Shepperson, Kirstin, PA-C  oxyCODONE (OXY IR/ROXICODONE) 5 MG immediate release tablet 1 po q 4 hrs prn pain.  Patient had a total knee replacement on 05/02/2018 Patient not taking: Reported on 07/28/2018 05/03/18   Shepperson, Kirstin, PA-C  polyethylene glycol (MIRALAX / GLYCOLAX) packet 17grams in 6 oz of something to drink twice a day until bowel movement.  LAXITIVE.  Restart if two days since last bowel movement Patient not taking: Reported on 07/28/2018 05/03/18   Matthew Saras, PA-C     Review of Systems  Positive ROS: As above  All other systems have been reviewed and were otherwise negative with the exception of those mentioned in the HPI and as above.  Objective: Vital signs in last 24 hours: Temp:  [97.7 F (36.5 C)] 97.7 F (36.5 C) (10/02 0712) Pulse Rate:  [91] 91 (10/02 0712) Resp:  [20] 20 (10/02 0712) BP: (131)/(96) 131/96 (10/02 0712) SpO2:  [97 %] 97 % (10/02 0712) Weight:  [99.8 kg] 99.8 kg (10/02 0737) Estimated body mass index is 38.97 kg/m as calculated from the following:   Height as of this encounter: 5\' 3"  (1.6 m).   Weight as of this encounter: 99.8 kg.   General Appearance: Alert Head: Normocephalic, without obvious abnormality, atraumatic Eyes: PERRL, conjunctiva/corneas clear, EOM's intact,    Ears: Normal  Throat: Normal  Neck: Supple, Back: unremarkable Lungs: Clear to auscultation bilaterally, respirations unlabored Heart: Regular rate and rhythm, no murmur, rub or gallop Abdomen: Soft, non-tender Extremities: Extremities normal, atraumatic, no  cyanosis or edema Skin: unremarkable  NEUROLOGIC:   Mental status: alert and oriented,Motor Exam - grossly normal Sensory Exam - grossly normal Reflexes:  Coordination - grossly normal Gait - grossly normal Balance - grossly normal Cranial Nerves: I: smell Not tested  II: visual acuity  OS: Normal  OD: Normal   II: visual fields Full to confrontation  II: pupils Equal, round, reactive to light  III,VII: ptosis None  III,IV,VI: extraocular muscles  Full ROM  V: mastication Normal  V: facial light touch sensation  Normal  V,VII: corneal reflex  Present  VII: facial muscle function - upper  Normal  VII: facial muscle function - lower Normal  VIII: hearing Not tested  IX: soft palate elevation  Normal  IX,X: gag reflex Present  XI: trapezius strength  5/5  XI: sternocleidomastoid strength 5/5  XI: neck flexion strength  5/5  XII: tongue strength  Normal    Data Review Lab Results  Component Value Date   WBC 6.7 08/02/2018   HGB 13.6 08/02/2018   HCT 41.9 08/02/2018   MCV 97.2 08/02/2018   PLT 223 08/02/2018   Lab Results  Component Value Date   NA 138 08/02/2018   K 3.8 08/02/2018   CL 104 08/02/2018   CO2 25 08/02/2018   BUN 17 08/02/2018   CREATININE 0.62 08/02/2018   GLUCOSE 97 08/02/2018   Lab Results  Component Value Date   INR 0.94 04/21/2018    Assessment/Plan: L4-5 spondylolisthesis, spinal stenosis, facet arthropathy, lumbago, lumbar radiculopathy, neurogenic claudication: I have discussed the situation with the patient and her husband.  I reviewed her imaging studies with her and pointed out the abnormalities.  We have discussed the various treatment options including surgery.  I have described the surgical treatment option of an L4-5 decompression, instrumentation and fusion.  I have shown her surgical models.  We have discussed the risks, benefits, alternatives, expected postoperative course, and likelihood of achieving our goals with surgery.  I have  answered all their questions.  She has decided to proceed with surgery.   [name] 08/10/2018 8:57 AM

## 2018-08-10 NOTE — Progress Notes (Signed)
Subjective: The patient is somnolent but easily arousable.  She is pleasant.  She has no apparent distress.  Her husband is at the bedside.  Objective: Vital signs in last 24 hours: Temp:  [97 F (36.1 C)-97.7 F (36.5 C)] 97.6 F (36.4 C) (10/02 1412) Pulse Rate:  [83-99] 99 (10/02 1412) Resp:  [9-26] 18 (10/02 1412) BP: (100-131)/(60-96) 120/60 (10/02 1412) SpO2:  [91 %-100 %] 97 % (10/02 1412) Weight:  [99.8 kg] 99.8 kg (10/02 0737) Estimated body mass index is 38.97 kg/m as calculated from the following:   Height as of this encounter: 5\' 3"  (1.6 m).   Weight as of this encounter: 99.8 kg.   Intake/Output from previous day: No intake/output data recorded. Intake/Output this shift: Total I/O In: 1370 [P.O.:120; I.V.:1000; IV Piggyback:250] Out: 335 [Urine:85; Blood:250]  Physical exam the patient is somnolent but easily arousable.  She is moving her lower extremities well.  Lab Results: No results for input(s): WBC, HGB, HCT, PLT in the last 72 hours. BMET No results for input(s): NA, K, CL, CO2, GLUCOSE, BUN, CREATININE, CALCIUM in the last 72 hours.  Studies/Results: Dg Lumbar Spine 2-3 Views  Result Date: 08/10/2018 CLINICAL DATA:  Status post posterior fusion L4 and L5 EXAM: DG C-ARM 61-120 MIN; LUMBAR SPINE - 2-3 VIEW COMPARISON:  Intraoperative radiograph August 10, 2018; lumbar radiographs March 08, 2018 FLUOROSCOPY TIME:  0 minutes 33 seconds; 2 acquired images FINDINGS: Frontal and lateral views obtained. There are pedicle screws transfixing the posterior elements at L4 and L5 bilaterally with pedicle screw tips in the respective vertebral bodies. There is a disc spacer at L4-5. No fracture or spondylolisthesis evident on intraoperative radiographs. Disc spaces appear unremarkable. IMPRESSION: Postoperative changes with pedicle screw tips in the respective vertebral bodies. Disc spacer at L4-5. No evident fracture or spondylolisthesis. Disc spaces appear unremarkable.  Electronically Signed   By: Lowella Grip III M.D.   On: 08/10/2018 11:58   Dg Lumbar Spine 1 View  Result Date: 08/10/2018 CLINICAL DATA:  Localization film. EXAM: LUMBAR SPINE - 1 VIEW COMPARISON:  Lumbar spine radiographs 03/08/2018 FINDINGS: A single intraoperative view of the lumbar spine is submitted. A surgical probe is at the L4 spinous process, at the L4-5 interspace level. IMPRESSION: Intraoperative localization of L4-5. Electronically Signed   By: San Morelle M.D.   On: 08/10/2018 10:43   Dg C-arm 1-60 Min  Result Date: 08/10/2018 CLINICAL DATA:  Status post posterior fusion L4 and L5 EXAM: DG C-ARM 61-120 MIN; LUMBAR SPINE - 2-3 VIEW COMPARISON:  Intraoperative radiograph August 10, 2018; lumbar radiographs March 08, 2018 FLUOROSCOPY TIME:  0 minutes 33 seconds; 2 acquired images FINDINGS: Frontal and lateral views obtained. There are pedicle screws transfixing the posterior elements at L4 and L5 bilaterally with pedicle screw tips in the respective vertebral bodies. There is a disc spacer at L4-5. No fracture or spondylolisthesis evident on intraoperative radiographs. Disc spaces appear unremarkable. IMPRESSION: Postoperative changes with pedicle screw tips in the respective vertebral bodies. Disc spacer at L4-5. No evident fracture or spondylolisthesis. Disc spaces appear unremarkable. Electronically Signed   By: Lowella Grip III M.D.   On: 08/10/2018 11:58    Assessment/Plan: The patient is doing well.  We will mobilize with PT and OT.  LOS: 0 days     [name] 08/10/2018, 3:05 PM

## 2018-08-10 NOTE — Transfer of Care (Signed)
Immediate Anesthesia Transfer of Care Note  Patient: Melissa Hunt  Procedure(s) Performed: POSTERIOR LUMBAR INTERBODY FUSION, INTERBODY PROSTHESIS, POSTERIOR FUSION INSTRUMENTATION LUMBAR FOUR - LUMBAR FIVE (N/A )  Patient Location: PACU  Anesthesia Type:General  Level of Consciousness: awake  Airway & Oxygen Therapy: Patient Spontanous Breathing  Post-op Assessment: Report given to RN and Patient moving all extremities X 4  Post vital signs: Reviewed and stable  Last Vitals:  Vitals Value Taken Time  BP    Temp    Pulse 97 08/10/2018 12:26 PM  Resp 14 08/10/2018 12:26 PM  SpO2 94 % 08/10/2018 12:26 PM  Vitals shown include unvalidated device data.  Last Pain:  Vitals:   08/10/18 0737  TempSrc:   PainSc: 7       Patients Stated Pain Goal: 3 (41/99/14 4458)  Complications: No apparent anesthesia complications

## 2018-08-10 NOTE — Anesthesia Procedure Notes (Signed)
Procedure Name: Intubation Date/Time: 08/10/2018 9:12 AM Performed by: Barrington Ellison, CRNA Pre-anesthesia Checklist: Patient identified, Emergency Drugs available, Suction available and Patient being monitored Patient Re-evaluated:Patient Re-evaluated prior to induction Oxygen Delivery Method: Circle System Utilized Preoxygenation: Pre-oxygenation with 100% oxygen Induction Type: IV induction Ventilation: Mask ventilation without difficulty Laryngoscope Size: Glidescope and 3 Grade View: Grade I Tube type: Oral Tube size: 7.0 mm Number of attempts: 1 Airway Equipment and Method: Stylet and Oral airway Placement Confirmation: ETT inserted through vocal cords under direct vision,  positive ETCO2 and breath sounds checked- equal and bilateral Secured at: 21 cm Tube secured with: Tape Dental Injury: Teeth and Oropharynx as per pre-operative assessment  Difficulty Due To: Difficulty was anticipated and Difficult Airway- due to limited oral opening Future Recommendations: Recommend- induction with short-acting agent, and alternative techniques readily available

## 2018-08-10 NOTE — Op Note (Signed)
Brief history: The patient is a 62 year old white Melissa Hunt who is complained of back and leg pain.  She has failed medical management.  She was worked up with a lumbar MRI which demonstrated L4-5 spinal listhesis, degenerative disease, stenosis, etc.  I discussed the various treatment options with her.  She has weighed the risks, benefits, and alternatives of surgery and decided proceed with an L4-5 decompression, instrumentation, and fusion.  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 Etc. requiring a wide decompression of the nerve roots.);  L4-5 transforaminal lumbar interbody fusion with local morselized autograft bone and Kinnex graft extender; insertion of interbody prosthesis at L4-5 (globus peek expandable interbody prosthesis); posterior nonsegmental instrumentation from L4 to L5 with globus titanium pedicle screws and rods; posterior lateral arthrodesis at L4-5 with local morselized autograft bone and Kinnex bone graft extender.  Surgeon: Dr. Earle Gell  Asst.: Dr. Christella Noa, Arnetha Massy nurse practitioner  Anesthesia: Gen. endotracheal  Estimated blood loss: 200 cc  Drains: None  Complications: None  Description of procedure: The patient was brought to the operating room by the anesthesia team. General endotracheal anesthesia was induced. The patient was turned to the prone position on the Wilson frame. The patient's lumbosacral region was then prepared with Betadine scrub and Betadine solution. Sterile drapes were applied.  I then injected  the area to be incised with Marcaine with epinephrine solution. I then used the scalpel to make a linear midline incision over the L4-5 interspace. I then used electrocautery to perform a bilateral subperiosteal dissection exposing the spinous process and lamina of L4 and L5. We then obtained intraoperative radiograph to confirm our location. We then inserted the Verstrac retractor to provide exposure.  I began the decompression by using the high speed drill to perform laminotomies at L4-5 bilaterally. We then used the Kerrison punches to widen the laminotomy and removed the ligamentum flavum at L4-5 bilaterally. We used the Kerrison punches to remove the medial facets at L4-5 bilaterally. We performed wide foraminotomies about the bilateral L4 and L5 nerve roots completing the decompression.  We now turned our attention to the posterior lumbar interbody fusion. I used a scalpel to incise the intervertebral disc at L4-5 bilaterally. I then performed a partial intervertebral discectomy at L4-5 bilaterally using the pituitary forceps. We prepared the vertebral endplates at K4-4 bilaterally for the fusion by removing the soft tissues with the curettes. We then used the trial spacers to pick the appropriate sized interbody prosthesis. We prefilled his prosthesis with a combination of local morselized autograft bone that we obtained during the decompression as well as Kinnex bone graft extender. We inserted the prefilled prosthesis into the interspace at L4-5 from the left, we then turned and expanded the prosthesis. There was a good snug fit of the prosthesis in the interspace. We then filled and the remainder of the intervertebral disc space with local morselized autograft bone and Kinnex. This completed the posterior lumbar interbody arthrodesis.  We now turned attention to the instrumentation. Under fluoroscopic guidance we cannulated the bilateral L4 and L5 pedicles with the bone probe. We then removed the  bone probe. We then  tapped the pedicle with a 6.5 millimeter tap. We then removed the tap. We probed inside the tapped pedicle with a ball probe to rule out cortical breaches. We then inserted a 7.5 x 40, 45 and 50 millimeter pedicle screw into the L4 and L5 pedicles bilaterally under fluoroscopic guidance. We then palpated along the medial aspect of the pedicles to rule out cortical breaches. There were none. The nerve roots were not injured. We then connected the unilateral pedicle screws with a lordotic rod. We compressed the construct and secured the rod in place with the caps. We then tightened the caps appropriately. This completed the instrumentation from L4-5.  We now turned our attention to the posterior lateral arthrodesis at L4-5 bilaterally. We used the high-speed drill to decorticate the remainder of the facets, pars, transverse process at L4-5 bilaterally. We then applied a combination of local morselized autograft bone and Kinnex bone graft extender over these decorticated posterior lateral structures. This completed the posterior lateral arthrodesis.  We then obtained hemostasis using bipolar electrocautery. We irrigated the wound out with bacitracin solution. We inspected the thecal sac and nerve roots and noted they were well decompressed. We then removed the retractor. We placed vancomycin powder in the wound. We reapproximated patient's thoracolumbar fascia with interrupted #1 Vicryl suture. We reapproximated patient's subcutaneous tissue with interrupted 2-0 Vicryl suture. The reapproximated patient's skin with Steri-Strips and benzoin. The wound was then coated with bacitracin ointment. A sterile dressing was applied. The drapes were removed. The patient was subsequently returned to the supine position where they were extubated by the anesthesia team. He was then transported to the post anesthesia care unit in stable condition. All sponge instrument and needle counts were reportedly correct  at the end of this case.

## 2018-08-10 NOTE — Anesthesia Postprocedure Evaluation (Signed)
Anesthesia Post Note  Patient: Maniya Donovan  Procedure(s) Performed: POSTERIOR LUMBAR INTERBODY FUSION, INTERBODY PROSTHESIS, POSTERIOR FUSION INSTRUMENTATION LUMBAR FOUR - LUMBAR FIVE (N/A )     Patient location during evaluation: PACU Anesthesia Type: General Level of consciousness: awake and alert Pain management: pain level controlled Vital Signs Assessment: post-procedure vital signs reviewed and stable Respiratory status: spontaneous breathing, nonlabored ventilation, respiratory function stable and patient connected to nasal cannula oxygen Cardiovascular status: blood pressure returned to baseline and stable Postop Assessment: no apparent nausea or vomiting Anesthetic complications: no    Last Vitals:  Vitals:   08/10/18 1350 08/10/18 1412  BP:  120/60  Pulse: 92 99  Resp: (!) 21 18  Temp: 36.5 C 36.4 C  SpO2: 98% 97%    Last Pain:  Vitals:   08/10/18 1420  TempSrc:   PainSc: 7       LLE Sensation: Full sensation (08/10/18 1420)   RLE Sensation: Full sensation (08/10/18 1420)      Tiajuana Amass

## 2018-08-11 LAB — BASIC METABOLIC PANEL
Anion gap: 9 (ref 5–15)
BUN: 20 mg/dL (ref 8–23)
CHLORIDE: 103 mmol/L (ref 98–111)
CO2: 25 mmol/L (ref 22–32)
CREATININE: 0.99 mg/dL (ref 0.44–1.00)
Calcium: 8.3 mg/dL — ABNORMAL LOW (ref 8.9–10.3)
GFR calc Af Amer: >60 mL/min (ref >60)
GFR calc non Af Amer: 60 mL/min — ABNORMAL LOW (ref >60)
GLUCOSE: 130 mg/dL — AB (ref 70–99)
Potassium: 3.6 mmol/L (ref 3.5–5.1)
Sodium: 137 mmol/L (ref 135–145)

## 2018-08-11 LAB — CBC
HCT: 34.5 % — ABNORMAL LOW (ref 36.0–46.0)
Hemoglobin: 11 g/dL — ABNORMAL LOW (ref 12.0–15.0)
MCH: 30.9 pg (ref 26.0–34.0)
MCHC: 31.9 g/dL (ref 30.0–36.0)
MCV: 96.9 fL (ref 78.0–100.0)
Platelets: 184 10*3/uL (ref 150–400)
RBC: 3.56 MIL/uL — ABNORMAL LOW (ref 3.87–5.11)
RDW: 13.2 % (ref 11.5–15.5)
WBC: 13.6 10*3/uL — ABNORMAL HIGH (ref 4.0–10.5)

## 2018-08-11 MED ORDER — ALUM & MAG HYDROXIDE-SIMETH 200-200-20 MG/5ML PO SUSP
30.0000 mL | Freq: Four times a day (QID) | ORAL | Status: DC | PRN
  Administered 2018-08-11 (×2): 30 mL via ORAL
  Filled 2018-08-11 (×2): qty 30

## 2018-08-11 MED ORDER — SODIUM CHLORIDE 0.9 % IV SOLN
INTRAVENOUS | Status: DC
  Administered 2018-08-11: 06:00:00 via INTRAVENOUS

## 2018-08-11 NOTE — Evaluation (Addendum)
Occupational Therapy Evaluation Patient Details Name: Melissa Hunt MRN: [id number] DOB: 10-15-1956 Today's Date: 08/11/2018    History of Present Illness Pt is a 62 y/o female s/p posterior lumbar interbody decompression and fusion L4-L5.  PMH signficant for but not limited to: anxiety, arthritis, HTN, B TKA.    Clinical Impression   PTA patient reports completing mobility with SPC, needing assistance with socks during dressing, and completed limited IADLs.  Patient reports returning to work after recent TKA, but having increased difficulty with performance and is very frustrated by this.  She was currently admitted for above and limited by below (see problem list).  Patient currently requires setup assist for UB ADLs, mod assist for LB ADLs, min assist for toilet transfers, and supervision for short distance mobility using RW.  She is anxious and requires cueing to attend to tasks during session, tangential expressions throughout session.  Educated on back precautions, brace management and wear schedule, ADL compensatory techniques, mobility, safety, energy conservation, and recommendations. Patient will benefit from continued OT services while admitted in order to address ADLs, IADLs and energy conservation in order to optimize independence, but anticipate no further OT needs at dc.  Will continue to follow.     Follow Up Recommendations  No OT follow up;Supervision - Intermittent    Equipment Recommendations  None recommended by OT(reports has access to [facility] if needed)    Recommendations for Other Services       Precautions / Restrictions Precautions Precautions: Back Precaution Booklet Issued: Yes (comment) Precaution Comments: reviewed precautions with patinet  Required Braces or Orthoses: Spinal Brace Spinal Brace: Lumbar corset;Applied in sitting position Restrictions Weight Bearing Restrictions: No      Mobility Bed Mobility               General bed mobility  comments: seated EOB upon entry  Transfers Overall transfer level: Needs assistance Equipment used: Rolling walker (2 wheeled) Transfers: Sit to/from Stand Sit to Stand: Min assist         General transfer comment: min assist for safety and balance    Balance Overall balance assessment: Mild deficits observed, not formally tested                                         ADL either performed or assessed with clinical judgement   ADL Overall ADL's : Needs assistance/impaired     Grooming: Supervision/safety;Standing;Cueing for compensatory techniques   Upper Body Bathing: Supervision/ safety;Sitting   Lower Body Bathing: Moderate assistance;Sit to/from stand;Cueing for compensatory techniques;Cueing for back precautions   Upper Body Dressing : Min guard;Sitting   Lower Body Dressing: Moderate assistance;Sit to/from stand;Cueing for compensatory techniques;Cueing for back precautions   Toilet Transfer: Minimal assistance;Ambulation;RW(simulated to recliner )     Toileting - Clothing Manipulation Details (indicate cue type and reason): verbally reviewed techniques with patient due to back precautions      Functional mobility during ADLs: Supervision/safety;Rolling walker General ADL Comments: Patient educated on back precautions, compensatory techniques and body mechanics for ADLs, transfers.      Vision Baseline Vision/History: Wears glasses Wears Glasses: At all times Patient Visual Report: No change from baseline Vision Assessment?: No apparent visual deficits     Perception     Praxis      Pertinent Vitals/Pain Pain Assessment: Faces Faces Pain Scale: Hurts little more Pain Location: back incision Pain Descriptors /  Indicators: Discomfort;Grimacing;Sharp Pain Intervention(s): Limited activity within patient's tolerance;Repositioned     Hand Dominance Right   Extremity/Trunk Assessment Upper Extremity Assessment Upper Extremity  Assessment: Generalized weakness   Lower Extremity Assessment Lower Extremity Assessment: Defer to PT evaluation   Cervical / Trunk Assessment Cervical / Trunk Assessment: Other exceptions Cervical / Trunk Exceptions: s/p lumbar surgery   Communication Communication Communication: No difficulties   Cognition Arousal/Alertness: Awake/alert Behavior During Therapy: Anxious Overall Cognitive Status: Within Functional Limits for tasks assessed                                 General Comments: pt anxious and tearful at times, tangential thoughts due to loss of indepedence and difficulties at work    General Comments       Exercises     Shoulder Instructions      Home Living Family/patient expects to be discharged to:: Private residence Living Arrangements: Spouse/significant other Available Help at Discharge: Family;Available 24 hours/day Type of Home: House Home Access: Stairs to enter [employer] of Steps: 3 Entrance Stairs-Rails: None Home Layout: Two level;Able to live on main level with bedroom/bathroom     Bathroom Shower/Tub: Walk-in shower   Bathroom Toilet: Handicapped height     Home Equipment: Environmental consultant - 2 wheels;Shower seat;Cane - single point          Prior Functioning/Environment Level of Independence: Needs assistance  Gait / Transfers Assistance Needed: used cane for mobility in community and at times in the house  ADL's / Homemaking Assistance Needed: reports needing assistance for donning socks, limited IADls             OT Problem List: Decreased strength;Decreased activity tolerance;Impaired balance (sitting and/or standing);Decreased safety awareness;Decreased knowledge of use of DME or AE;Decreased knowledge of precautions;Pain      OT Treatment/Interventions: Self-care/ADL training;Therapeutic exercise;Energy conservation;DME and/or AE instruction;Therapeutic activities;Patient/family education;Balance training     OT Goals(Current goals can be found in the care plan section) Acute Rehab OT Goals Patient Stated Goal: to get back to everday life  OT Goal Formulation: With patient Time For Goal Achievement: 08/25/18 Potential to Achieve Goals: Good  OT Frequency: Min 2X/week   Barriers to D/C:            Co-evaluation              AM-PAC PT "6 Clicks" Daily Activity     Outcome Measure Help from another person eating meals?: None Help from another person taking care of personal grooming?: None Help from another person toileting, which includes using toliet, bedpan, or urinal?: A Little Help from another person bathing (including washing, rinsing, drying)?: A Lot Help from another person to put on and taking off regular upper body clothing?: None Help from another person to put on and taking off regular lower body clothing?: A Lot 6 Click Score: 19   End of Session Equipment Utilized During Treatment: Rolling walker;Back brace Nurse Communication: Mobility status  Activity Tolerance: Patient tolerated treatment well Patient left: in chair;with call bell/phone within reach  OT Visit Diagnosis: Unsteadiness on feet (R26.81);Pain Pain - part of body: (back incision)                Time: 5643-3295 OT Time Calculation (min): 31 min Charges:  OT General Charges $OT Visit: 1 Visit OT Evaluation $OT Eval Moderate Complexity: 1 Mod OT Treatments $Self Care/Home Management : 8-22 mins  Adonis Brook  [place], OT Acute Rehabilitation Services Pager [phone] Office [phone]   Delight Stare 08/11/2018, 8:25 AM

## 2018-08-11 NOTE — Progress Notes (Signed)
Subjective: The patient is alert and pleasant.  She looks well.  She felt a bit lightheaded when she walked.  Her back is appropriately sore.  Objective: Vital signs in last 24 hours: Temp:  [97 F (36.1 C)-98.9 F (37.2 C)] 98.3 F (36.8 C) (10/03 1012) Pulse Rate:  [79-104] 81 (10/03 1012) Resp:  [9-26] 20 (10/03 1012) BP: (89-128)/(41-93) 94/41 (10/03 1012) SpO2:  [91 %-100 %] 100 % (10/03 1012) Estimated body mass index is 38.97 kg/m as calculated from the following:   Height as of this encounter: 5\' 3"  (1.6 m).   Weight as of this encounter: 99.8 kg.   Intake/Output from previous day: 10/02 0701 - 10/03 0700 In: 2067.9 [P.O.:600; I.V.:1017.9; IV Piggyback:450] Out: 635 [Urine:85; Emesis/NG output:300; Blood:250] Intake/Output this shift: No intake/output data recorded.  Physical exam the patient is alert and pleasant.  She is in no apparent distress.  She is moving her lower extremities well.  Lab Results: Recent Labs    08/11/18 0829  WBC 13.6*  HGB 11.0*  HCT 34.5*  PLT 184   BMET Recent Labs    08/11/18 0829  NA 137  K 3.6  CL 103  CO2 25  GLUCOSE 130*  BUN 20  CREATININE 0.99  CALCIUM 8.3*    Studies/Results: Dg Lumbar Spine 2-3 Views  Result Date: 08/10/2018 CLINICAL DATA:  Status post posterior fusion L4 and L5 EXAM: DG C-ARM 61-120 MIN; LUMBAR SPINE - 2-3 VIEW COMPARISON:  Intraoperative radiograph August 10, 2018; lumbar radiographs March 08, 2018 FLUOROSCOPY TIME:  0 minutes 33 seconds; 2 acquired images FINDINGS: Frontal and lateral views obtained. There are pedicle screws transfixing the posterior elements at L4 and L5 bilaterally with pedicle screw tips in the respective vertebral bodies. There is a disc spacer at L4-5. No fracture or spondylolisthesis evident on intraoperative radiographs. Disc spaces appear unremarkable. IMPRESSION: Postoperative changes with pedicle screw tips in the respective vertebral bodies. Disc spacer at L4-5. No evident  fracture or spondylolisthesis. Disc spaces appear unremarkable. Electronically Signed   By: Lowella Grip III M.D.   On: 08/10/2018 11:58   Dg Lumbar Spine 1 View  Result Date: 08/10/2018 CLINICAL DATA:  Localization film. EXAM: LUMBAR SPINE - 1 VIEW COMPARISON:  Lumbar spine radiographs 03/08/2018 FINDINGS: A single intraoperative view of the lumbar spine is submitted. A surgical probe is at the L4 spinous process, at the L4-5 interspace level. IMPRESSION: Intraoperative localization of L4-5. Electronically Signed   By: San Morelle M.D.   On: 08/10/2018 10:43   Dg C-arm 1-60 Min  Result Date: 08/10/2018 CLINICAL DATA:  Status post posterior fusion L4 and L5 EXAM: DG C-ARM 61-120 MIN; LUMBAR SPINE - 2-3 VIEW COMPARISON:  Intraoperative radiograph August 10, 2018; lumbar radiographs March 08, 2018 FLUOROSCOPY TIME:  0 minutes 33 seconds; 2 acquired images FINDINGS: Frontal and lateral views obtained. There are pedicle screws transfixing the posterior elements at L4 and L5 bilaterally with pedicle screw tips in the respective vertebral bodies. There is a disc spacer at L4-5. No fracture or spondylolisthesis evident on intraoperative radiographs. Disc spaces appear unremarkable. IMPRESSION: Postoperative changes with pedicle screw tips in the respective vertebral bodies. Disc spacer at L4-5. No evident fracture or spondylolisthesis. Disc spaces appear unremarkable. Electronically Signed   By: Lowella Grip III M.D.   On: 08/10/2018 11:58    Assessment/Plan: Postop day #1: The patient is doing well.  We will continue to mobilize her with PT and OT.  She will likely go  home tomorrow.  She is mildly anemic.  LOS: 1 day     Melissa Hunt 08/11/2018, 11:30 AM

## 2018-08-11 NOTE — Evaluation (Signed)
Physical Therapy Evaluation Patient Details Name: Melissa Hunt MRN: [id number] DOB: 22-Feb-1956 Today's Date: 08/11/2018   History of Present Illness  Pt is a 62 y/o female s/p posterior lumbar interbody decompression and fusion L4-L5.  PMH signficant for but not limited to: anxiety, arthritis, HTN, B TKA.   Clinical Impression  Patient is s/p above surgery resulting in the deficits listed below (see PT Problem List). Prior to admission, patient is a limited community ambulator using cane. Currently, ambulating 400 feet using walker and supervision. Requiring up to min assist for transfers. Will perform stair training prior to discharge home. Patient will benefit from skilled PT to increase their independence and safety with mobility (while adhering to their precautions) to allow discharge to the venue listed below.     Follow Up Recommendations No PT follow up;Supervision for mobility/OOB    Equipment Recommendations  Rolling walker with 5" wheels    Recommendations for Other Services       Precautions / Restrictions Precautions Precautions: Back Precaution Booklet Issued: Yes (comment) Precaution Comments: reviewed precautions with patinet  Required Braces or Orthoses: Spinal Brace Spinal Brace: Lumbar corset;Applied in sitting position Restrictions Weight Bearing Restrictions: No      Mobility  Bed Mobility Overal bed mobility: Needs Assistance Bed Mobility: Supine to Sit     Supine to sit: Min guard     General bed mobility comments: min guard for safety. increased time and effort with cues for log roll technique  Transfers Overall transfer level: Needs assistance Equipment used: Rolling walker (2 wheeled) Transfers: Sit to/from Stand Sit to Stand: Min assist         General transfer comment: light min assist provided with increased time required  Ambulation/Gait Ambulation/Gait assistance: Supervision Gait Distance (Feet): 400 Feet Assistive device:  Rolling walker (2 wheeled) Gait Pattern/deviations: Step-through pattern;Wide base of support;Decreased dorsiflexion - right;Decreased dorsiflexion - left Gait velocity: decr   General Gait Details: Patient lightly using walker for balance and no evidence of gross unsteadiness. Good posture utilized  [employer] Rankin (Stroke Patients Only)       Balance Overall balance assessment: Mild deficits observed, not formally tested                                           Pertinent Vitals/Pain Pain Assessment: Faces Faces Pain Scale: Hurts little more Pain Location: back incision Pain Descriptors / Indicators: Discomfort;Grimacing;Sharp Pain Intervention(s): Limited activity within patient's tolerance;Monitored during session    Home Living Family/patient expects to be discharged to:: Private residence Living Arrangements: Spouse/significant other Available Help at Discharge: Family;Available 24 hours/day Type of Home: House Home Access: Stairs to enter Entrance Stairs-Rails: None Entrance Stairs-Number of Steps: 3 Home Layout: Two level;Able to live on main level with bedroom/bathroom Home Equipment: Shower seat;Cane - single point;Walker - standard      Prior Function Level of Independence: Needs assistance   Gait / Transfers Assistance Needed: used cane for mobility in community and at times in the house   ADL's / Homemaking Assistance Needed: reports needing assistance for donning socks, limited IADls         Hand Dominance   Dominant Hand: Right    Extremity/Trunk Assessment   Upper Extremity Assessment Upper Extremity Assessment: Defer to OT evaluation  Lower Extremity Assessment Lower Extremity Assessment: RLE deficits/detail;LLE deficits/detail RLE Deficits / Details: Grossly WFL except hip flexion 2+/5 (limited by pain). patient reporting numbness in 1nd and 2nd toes LLE Deficits / Details:  Grossly WFL except hip flexion 2+/5 (limited by pain)    Cervical / Trunk Assessment Cervical / Trunk Assessment: Other exceptions Cervical / Trunk Exceptions: s/p lumbar surgery  Communication   Communication: No difficulties  Cognition Arousal/Alertness: Awake/alert Behavior During Therapy: WFL for tasks assessed/performed Overall Cognitive Status: Within Functional Limits for tasks assessed                                 General Comments: pt anxious and tearful at times, tangential thoughts due to loss of indepedence and difficulties at work       General Comments      Exercises     Assessment/Plan    PT Assessment Patient needs continued PT services  PT Problem List Decreased strength;Decreased activity tolerance;Decreased balance;Decreased mobility;Pain       PT Treatment Interventions Gait training;DME instruction;Functional mobility training;Stair training;Therapeutic activities;Therapeutic exercise;Balance training;Patient/family education    PT Goals (Current goals can be found in the Care Plan section)  Acute Rehab PT Goals Patient Stated Goal: to get back to everday life  PT Goal Formulation: With patient Time For Goal Achievement: 08/13/18 Potential to Achieve Goals: Good    Frequency Min 5X/week   Barriers to discharge        Co-evaluation               AM-PAC PT "6 Clicks" Daily Activity  Outcome Measure Difficulty turning over in bed (including adjusting bedclothes, sheets and blankets)?: A Little Difficulty moving from lying on back to sitting on the side of the bed? : A Lot Difficulty sitting down on and standing up from a chair with arms (e.g., wheelchair, bedside commode, etc,.)?: Unable Help needed moving to and from a bed to chair (including a wheelchair)?: A Little Help needed walking in hospital room?: A Little Help needed climbing 3-5 steps with a railing? : A Little 6 Click Score: 15    End of Session Equipment  Utilized During Treatment: Gait belt;Back brace Activity Tolerance: Patient tolerated treatment well Patient left: in bed;with call bell/phone within reach Nurse Communication: Mobility status PT Visit Diagnosis: Unsteadiness on feet (R26.81);Difficulty in walking, not elsewhere classified (R26.2);Muscle weakness (generalized) (M62.81);Pain Pain - part of body: (back)    Time: 6734-1937 PT Time Calculation (min) (ACUTE ONLY): 23 min   Charges:   PT Evaluation $PT Eval Low Complexity: 1 Low PT Treatments $Therapeutic Activity: 8-22 mins        Ellamae Sia, PT, DPT Acute Rehabilitation Services Pager [phone] Office [phone]  Willy Eddy 08/11/2018, 9:18 AM

## 2018-08-12 MED FILL — Sodium Chloride IV Soln 0.9%: INTRAVENOUS | Qty: 1000 | Status: AC

## 2018-08-12 MED FILL — Heparin Sodium (Porcine) Inj 1000 Unit/ML: INTRAMUSCULAR | Qty: 30 | Status: AC

## 2018-08-12 NOTE — Plan of Care (Signed)
  Problem: Education: Goal: Ability to verbalize activity precautions or restrictions will improve Outcome: Progressing Goal: Knowledge of the prescribed therapeutic regimen will improve Outcome: Progressing Goal: Understanding of discharge needs will improve Outcome: Progressing   

## 2018-08-12 NOTE — Progress Notes (Signed)
Physical Therapy Treatment Patient Details Name: Melissa Hunt MRN: [id number] DOB: [birth year] Today's Date: 08/12/2018    History of Present Illness Pt is a 62 y/o female s/p posterior lumbar interbody decompression and fusion L4-L5.  PMH signficant for but not limited to: anxiety, arthritis, HTN, B TKA.     PT Comments    Pt making steady progress with functional mobility. She tolerated stair training well with min guard and cueing for technique. Plan is for pt to d/c home tomorrow. Pt is ready to d/c home from PT perspective.   Follow Up Recommendations  No PT follow up;Supervision for mobility/OOB     Equipment Recommendations  Rolling walker with 5" wheels    Recommendations for Other Services       Precautions / Restrictions Precautions Precautions: Back Precaution Booklet Issued: Yes (comment) Precaution Comments: pt recall 3/3 precautions with independence Required Braces or Orthoses: Spinal Brace Spinal Brace: Lumbar corset;Applied in sitting position Restrictions Weight Bearing Restrictions: No    Mobility  Bed Mobility Overal bed mobility: Needs Assistance Bed Mobility: Sidelying to Sit;Rolling Rolling: Supervision Sidelying to sit: Supervision       General bed mobility comments: pt OOB in recliner chair  Transfers Overall transfer level: Needs assistance Equipment used: Rolling walker (2 wheeled) Transfers: Sit to/from Stand Sit to Stand: Supervision         General transfer comment: supervision for safety, good technqiue and body mechanics   Ambulation/Gait Ambulation/Gait assistance: Supervision Gait Distance (Feet): 150 Feet Assistive device: Rolling walker (2 wheeled) Gait Pattern/deviations: Step-through pattern;Decreased stride length Gait velocity: decreased Gait velocity interpretation: 1.31 - 2.62 ft/sec, indicative of limited community ambulator General Gait Details: pt steady with RW, no difficulties or need for physical  assistance   Stairs Stairs: Yes Stairs assistance: Min guard Stair Management: No rails;Step to pattern;Backwards;With walker Number of Stairs: 1 General stair comments: pt reported that her steps are wide and deep enough to fit a RW onto; therefore practiced ascending/descending one step with RW backwards; min guard for safety, cueing for technique   Wheelchair Mobility    Modified Rankin (Stroke Patients Only)       Balance Overall balance assessment: Needs assistance Sitting-balance support: No upper extremity supported;Feet supported Sitting balance-Leahy Scale: Good     Standing balance support: Bilateral upper extremity supported;Single extremity supported Standing balance-Leahy Scale: Poor                              Cognition Arousal/Alertness: Awake/alert Behavior During Therapy: WFL for tasks assessed/performed Overall Cognitive Status: Within Functional Limits for tasks assessed                                        Exercises      General Comments        Pertinent Vitals/Pain Pain Assessment: Faces Faces Pain Scale: Hurts a little bit Pain Location: back incision Pain Descriptors / Indicators: Discomfort;Sore Pain Intervention(s): Monitored during session;Repositioned    Home Living                      Prior Function            PT Goals (current goals can now be found in the care plan section) Acute Rehab PT Goals Patient Stated Goal: to get back to everday life  PT  Goal Formulation: With patient Time For Goal Achievement: 08/13/18 Potential to Achieve Goals: Good Progress towards PT goals: Progressing toward goals    Frequency    Min 5X/week      PT Plan Current plan remains appropriate    Co-evaluation              AM-PAC PT "6 Clicks" Daily Activity  Outcome Measure  Difficulty turning over in bed (including adjusting bedclothes, sheets and blankets)?: A Little Difficulty moving  from lying on back to sitting on the side of the bed? : A Little Difficulty sitting down on and standing up from a chair with arms (e.g., wheelchair, bedside commode, etc,.)?: Unable Help needed moving to and from a bed to chair (including a wheelchair)?: A Little Help needed walking in hospital room?: A Little Help needed climbing 3-5 steps with a railing? : A Little 6 Click Score: 16    End of Session Equipment Utilized During Treatment: Gait belt;Back brace Activity Tolerance: Patient tolerated treatment well Patient left: in chair;with call bell/phone within reach Nurse Communication: Mobility status PT Visit Diagnosis: Pain;Other abnormalities of gait and mobility (R26.89) Pain - part of body: (back)     Time: 2119-4174 PT Time Calculation (min) (ACUTE ONLY): 13 min  Charges:  $Gait Training: 8-22 mins                     Sherie Don, [place], DPT  Acute Rehabilitation Services Pager [phone] Office [place] 08/12/2018, 11:53 AM

## 2018-08-12 NOTE — Progress Notes (Addendum)
Occupational Therapy Treatment Patient Details Name: Melissa Hunt MRN: [id number] DOB: 08-17-1956 Today's Date: 08/12/2018    History of present illness Pt is a 62 y/o female s/p posterior lumbar interbody decompression and fusion L4-L5.  PMH signficant for but not limited to: anxiety, arthritis, HTN, B TKA.    OT comments  Pt progressing well.  Completes bed mobility and functional transfers with supervision, educated on energy conservation techniques and use of AE for LB self care (with patient return demonstrating use of AE with supervision). Patient recalls and adheres to back precautions without cueing. Patient will have support of husband at home and voiced understanding with recommendations and education.  Will continue to follow while admitted.    Follow Up Recommendations  No OT follow up;Supervision - Intermittent    Equipment Recommendations  None recommended by OT    Recommendations for Other Services      Precautions / Restrictions Precautions Precautions: Back Precaution Booklet Issued: Yes (comment) Precaution Comments: pt recall 3/3 precautions with independence Required Braces or Orthoses: Spinal Brace Spinal Brace: Lumbar corset;Applied in sitting position Restrictions Weight Bearing Restrictions: No       Mobility Bed Mobility Overal bed mobility: Needs Assistance Bed Mobility: Sidelying to Sit;Rolling Rolling: Supervision Sidelying to sit: Supervision       General bed mobility comments: supervision for safety, good recall of log roll technique   Transfers Overall transfer level: Needs assistance Equipment used: Rolling walker (2 wheeled) Transfers: Sit to/from Stand Sit to Stand: Supervision         General transfer comment: supervision for safety, good technqiue and body mechanics     Balance Overall balance assessment: Mild deficits observed, not formally tested                                         ADL either  performed or assessed with clinical judgement   ADL Overall ADL's : Needs assistance/impaired             Lower Body Bathing: Minimal assistance;Sitting/lateral leans;Cueing for compensatory techniques;With adaptive equipment Lower Body Bathing Details (indicate cue type and reason): reviewed use of long handled sponge for LB bathing, bathing seated with lateral leans and sequencing steps Upper Body Dressing : Set up;Sitting;Supervision/safety Upper Body Dressing Details (indicate cue type and reason): donning 2nd gown with setup, donning lumbar brace with supervision Lower Body Dressing: Supervision/safety;Set up;With adaptive equipment;Sit to/from stand Lower Body Dressing Details (indicate cue type and reason): pt completed LB dressing with socks using AE (reacher/sock aide) with supervision, reviwed use of reacher with pants Toilet Transfer: Supervision/safety;Ambulation;RW(simulated to recliner ) Toilet Transfer Details (indicate cue type and reason): good technique and body mechanics    Toileting - Clothing Manipulation Details (indicate cue type and reason): demonstrated use of tongs for [employer] Transfer Details (indicate cue type and reason): patient voices understanding with safety completing walk in shower, demonstrates technique supporting self on wall  Functional mobility during ADLs: Supervision/safety;Rolling walker General ADL Comments: continued education on back precautions, compensatory techniques and body mechanics for ADLs, transfers.      Vision       Perception     Praxis      Cognition Arousal/Alertness: Awake/alert Behavior During Therapy: WFL for tasks assessed/performed Overall Cognitive Status: Within Functional Limits for tasks assessed  Exercises     Shoulder Instructions       General Comments      Pertinent Vitals/ Pain       Pain Assessment: Faces Faces Pain Scale:  Hurts a little bit Pain Location: back incision Pain Descriptors / Indicators: Discomfort;Grimacing Pain Intervention(s): Limited activity within patient's tolerance;Repositioned  Home Living                                          Prior Functioning/Environment              Frequency  Min 2X/week        Progress Toward Goals  OT Goals(current goals can now be found in the care plan section)  Progress towards OT goals: Progressing toward goals  Acute Rehab OT Goals Patient Stated Goal: to get back to everday life  OT Goal Formulation: With patient Time For Goal Achievement: 08/25/18 Potential to Achieve Goals: Good  Plan Discharge plan remains appropriate;Frequency remains appropriate    Co-evaluation                 AM-PAC PT "6 Clicks" Daily Activity     Outcome Measure   Help from another person eating meals?: None Help from another person taking care of personal grooming?: None Help from another person toileting, which includes using toliet, bedpan, or urinal?: A Little Help from another person bathing (including washing, rinsing, drying)?: A Little Help from another person to put on and taking off regular upper body clothing?: None Help from another person to put on and taking off regular lower body clothing?: A Little 6 Click Score: 21    End of Session Equipment Utilized During Treatment: Rolling walker;Back brace  OT Visit Diagnosis: Unsteadiness on feet (R26.81);Pain Pain - part of body: (back incision)   Activity Tolerance Patient tolerated treatment well   Patient Left in chair;with call bell/phone within reach   Nurse Communication Mobility status        Time: 7121-9758 OT Time Calculation (min): 22 min  Charges: OT General Charges $OT Visit: 1 Visit OT Treatments $Self Care/Home Management : 8-22 mins  Delight Stare, [place] Pager [phone] Office [place] 08/12/2018, 8:15 AM

## 2018-08-12 NOTE — Progress Notes (Signed)
Subjective: The patient is alert and pleasant.  She looks well.  She had some sciatica yesterday.  She wants to go home tomorrow.  Objective: Vital signs in last 24 hours: Temp:  [98 F (36.7 C)-100.8 F (38.2 C)] 99 F (37.2 C) (10/04 0328) Pulse Rate:  [76-112] 97 (10/04 0328) Resp:  [18-20] 18 (10/04 0328) BP: (94-126)/(41-81) 121/66 (10/04 0328) SpO2:  [95 %-100 %] 97 % (10/04 0328) Estimated body mass index is 38.97 kg/m as calculated from the following:   Height as of this encounter: 5\' 3"  (1.6 m).   Weight as of this encounter: 99.8 kg.   Intake/Output from previous day: 10/03 0701 - 10/04 0700 In: 1296.7 [I.V.:1296.7] Out: -  Intake/Output this shift: No intake/output data recorded.  Physical exam patient is alert and pleasant.  She looks well.  Her lower extremity strength is normal.  Lab Results: Recent Labs    08/11/18 0829  WBC 13.6*  HGB 11.0*  HCT 34.5*  PLT 184   BMET Recent Labs    08/11/18 0829  NA 137  K 3.6  CL 103  CO2 25  GLUCOSE 130*  BUN 20  CREATININE 0.99  CALCIUM 8.3*    Studies/Results: Dg Lumbar Spine 2-3 Views  Result Date: 08/10/2018 CLINICAL DATA:  Status post posterior fusion L4 and L5 EXAM: DG C-ARM 61-120 MIN; LUMBAR SPINE - 2-3 VIEW COMPARISON:  Intraoperative radiograph August 10, 2018; lumbar radiographs March 08, 2018 FLUOROSCOPY TIME:  0 minutes 33 seconds; 2 acquired images FINDINGS: Frontal and lateral views obtained. There are pedicle screws transfixing the posterior elements at L4 and L5 bilaterally with pedicle screw tips in the respective vertebral bodies. There is a disc spacer at L4-5. No fracture or spondylolisthesis evident on intraoperative radiographs. Disc spaces appear unremarkable. IMPRESSION: Postoperative changes with pedicle screw tips in the respective vertebral bodies. Disc spacer at L4-5. No evident fracture or spondylolisthesis. Disc spaces appear unremarkable. Electronically Signed   By: Lowella Grip III M.D.   On: 08/10/2018 11:58   Dg Lumbar Spine 1 View  Result Date: 08/10/2018 CLINICAL DATA:  Localization film. EXAM: LUMBAR SPINE - 1 VIEW COMPARISON:  Lumbar spine radiographs 03/08/2018 FINDINGS: A single intraoperative view of the lumbar spine is submitted. A surgical probe is at the L4 spinous process, at the L4-5 interspace level. IMPRESSION: Intraoperative localization of L4-5. Electronically Signed   By: San Morelle M.D.   On: 08/10/2018 10:43   Dg C-arm 1-60 Min  Result Date: 08/10/2018 CLINICAL DATA:  Status post posterior fusion L4 and L5 EXAM: DG C-ARM 61-120 MIN; LUMBAR SPINE - 2-3 VIEW COMPARISON:  Intraoperative radiograph August 10, 2018; lumbar radiographs March 08, 2018 FLUOROSCOPY TIME:  0 minutes 33 seconds; 2 acquired images FINDINGS: Frontal and lateral views obtained. There are pedicle screws transfixing the posterior elements at L4 and L5 bilaterally with pedicle screw tips in the respective vertebral bodies. There is a disc spacer at L4-5. No fracture or spondylolisthesis evident on intraoperative radiographs. Disc spaces appear unremarkable. IMPRESSION: Postoperative changes with pedicle screw tips in the respective vertebral bodies. Disc spacer at L4-5. No evident fracture or spondylolisthesis. Disc spaces appear unremarkable. Electronically Signed   By: Lowella Grip III M.D.   On: 08/10/2018 11:58    Assessment/Plan: Postop day #2: The patient is doing well.  She will likely go home tomorrow.  I gave her discharge instructions and answered all her questions.  LOS: 2 days     [name]  08/12/2018, 7:08 AM

## 2018-08-13 MED ORDER — MAGNESIUM CITRATE PO SOLN
1.0000 | Freq: Once | ORAL | Status: AC
  Administered 2018-08-13: 1 via ORAL
  Filled 2018-08-13: qty 296

## 2018-08-13 MED ORDER — OXYCODONE HCL 10 MG PO TABS: 10.0000 mg | ORAL_TABLET | ORAL | 0 refills | Status: DC | PRN

## 2018-08-13 NOTE — Discharge Instructions (Signed)
Wound Care Keep incision covered and dry for two days.  If you shower, cover incision with plastic wrap.  Do not put any creams, lotions, or ointments on incision. Leave steri-strips on back.  They will fall off by themselves. Activity Walk each and every day, increasing distance each day. No lifting greater than 5 lbs.  Avoid excessive neck motion. No driving for 2 weeks; may ride as a passenger locally. If provided with back brace, wear when out of bed.  It is not necessary to wear brace in bed. Diet Resume your normal diet.  Return to Work Will be discussed at you follow up appointment. Call Your Doctor If Any of These Occur Redness, drainage, or swelling at the wound.  Temperature greater than 101 degrees. Severe pain not relieved by pain medication. Incision starts to come apart. Follow Up Appt Call today for appointment in 3 weeks [phone]) or for problems.  If you have any hardware placed in your spine, you will need an x-ray before your appointment.

## 2018-08-13 NOTE — Progress Notes (Signed)
Patient is discharged from room 3C11 at this time. Alert and in stable condition. IV site d/c'd and instructions read to patient and spouse with understanding verbalized. Left unit via wheelchair with all belongings at side. 

## 2018-08-13 NOTE — Discharge Summary (Signed)
Physician Discharge Summary  Patient ID: Virlee Stroschein MRN: [id number] DOB/AGE: 01/12/1956 62 y.o. Estimated body mass index is 38.97 kg/m as calculated from the following:   Height as of this encounter: 5\' 3"  (1.6 m).   Weight as of this encounter: 99.8 kg.   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  Postoperatively patient did very well with covering the floor on the floor was ablating and voiding spontaneously tolerating regular diet stable for discharge home.  Consults: Significant Diagnostic Studies: Treatments: L4-5 transforaminal interbody fusion Discharge Exam: Blood pressure 113/72, pulse (!) 115, temperature (!) 100.5 F (38.1 C), temperature source Oral, resp. rate 20, height 5\' 3"  (1.6 m), weight 99.8 kg, SpO2 93 %. Strength 5 out of 5 wound clean dry and intact  Disposition: Home   Allergies as of 08/13/2018   No Known Allergies     Medication List    TAKE these medications   acetaminophen 500 MG tablet Commonly known as:  TYLENOL Take 500-1,000 mg by mouth every 6 (six) hours as needed for moderate pain or headache.   aspirin 325 MG EC tablet 1 tab a day for the next 30 days to prevent blood clots   clobetasol cream 0.05 % Commonly known as:  TEMOVATE Apply 1 application topically 2 (two) times daily as needed (dermatosis).   docusate sodium 100 MG capsule Commonly known as:  COLACE 1 tab 2 times a day while on narcotics.  STOOL SOFTENER   escitalopram 10 MG tablet Commonly known as:  LEXAPRO Take 10 mg by mouth daily.   folic acid 1 MG tablet Commonly known as:  FOLVITE Take 1 mg by mouth daily.   gabapentin 100 MG  capsule Commonly known as:  NEURONTIN Take 100 mg by mouth at bedtime.   levothyroxine 150 MCG tablet Commonly known as:  SYNTHROID, LEVOTHROID Take 150 mcg by mouth every morning. 30-60 MINS BEFORE BREAKFAST   meloxicam 7.5 MG tablet Commonly known as:  MOBIC Take 7.5 mg by mouth daily.   methotrexate 2.5 MG tablet Commonly known as:  RHEUMATREX Take 20 mg by mouth every Monday.   olmesartan 20 MG tablet Commonly known as:  BENICAR Take 10 mg by mouth at bedtime.   oxyCODONE 5 MG immediate release tablet Commonly known as:  Oxy IR/ROXICODONE 1 po q 4 hrs prn pain.  Patient had a total knee replacement on 05/02/2018 What changed:  Another medication with the same name was added. Make sure you understand how and when to take each.   Oxycodone HCl 10 MG Tabs Take 1 tablet (10 mg total) by mouth every 3 (three) hours as needed for severe pain ((score 7 to 10)). What changed:  You were already taking a medication with the same name, and this prescription was added. Make sure you understand how and when to take each.   polyethylene glycol packet Commonly known as:  MIRALAX / GLYCOLAX 17grams in 6 oz of something to drink twice a day until bowel movement.  LAXITIVE.  Restart if two days since last bowel movement   temazepam 15 MG capsule Commonly known as:  RESTORIL Take 15 mg by mouth at bedtime.   traMADol 50 MG tablet Commonly known as:  ULTRAM Take 50 mg by mouth  at bedtime.   TUMS ULTRA 1000 400 MG chewable tablet Generic drug:  calcium elemental as carbonate Chew 2,000 mg by mouth daily as needed for heartburn.        Signed: Devynn Scheff P 08/13/2018, 6:46 AM

## 2018-08-13 NOTE — Progress Notes (Signed)
Physical Therapy Treatment Patient Details Name: Melissa Hunt MRN: [id number] DOB: 1956-10-19 Today's Date: 08/13/2018    History of Present Illness Pt is a 62 y/o female s/p posterior lumbar interbody decompression and fusion L4-L5.  PMH signficant for but not limited to: anxiety, arthritis, HTN, B TKA.     PT Comments    Patient seen for mobility progression s/p spinal surgery. Mobilizing well today in preparation for d/c home. Re-educated patient on precautions, mobility expectations, safety and car transfers. No further acute PT needs. Will sign off.    Follow Up Recommendations  No PT follow up;Supervision for mobility/OOB     Equipment Recommendations  Rolling walker with 5" wheels    Recommendations for Other Services       Precautions / Restrictions Precautions Precautions: Back Precaution Booklet Issued: Yes (comment) Precaution Comments: pt recall 3/3 precautions with independence Required Braces or Orthoses: Spinal Brace Spinal Brace: Lumbar corset;Applied in sitting position Restrictions Weight Bearing Restrictions: No    Mobility  Bed Mobility               General bed mobility comments: pt OOB in recliner chair  Transfers Overall transfer level: Modified independent Equipment used: Rolling walker (2 wheeled) Transfers: Sit to/from Stand Sit to Stand: Modified independent (Device/Increase time)         General transfer comment: increased time to perform, no physical assist required  Ambulation/Gait Ambulation/Gait assistance: Modified independent (Device/Increase time) Gait Distance (Feet): 340 Feet Assistive device: Rolling walker (2 wheeled) Gait Pattern/deviations: Step-through pattern;Decreased stride length Gait velocity: decreased   General Gait Details: slow but steady with use of RW   Stairs             Wheelchair Mobility    Modified Rankin (Stroke Patients Only)       Balance Overall balance assessment: Needs  assistance Sitting-balance support: No upper extremity supported;Feet supported Sitting balance-Leahy Scale: Good     Standing balance support: Bilateral upper extremity supported;Single extremity supported Standing balance-Leahy Scale: Fair Standing balance comment: able to release for short periods                            Cognition Arousal/Alertness: Awake/alert Behavior During Therapy: WFL for tasks assessed/performed Overall Cognitive Status: Within Functional Limits for tasks assessed                                        Exercises      General Comments        Pertinent Vitals/Pain Pain Assessment: Faces Faces Pain Scale: Hurts a little bit Pain Location: back incision Pain Descriptors / Indicators: Discomfort;Sore Pain Intervention(s): Monitored during session    Home Living                      Prior Function            PT Goals (current goals can now be found in the care plan section) Acute Rehab PT Goals Patient Stated Goal: to get back to everday life  PT Goal Formulation: With patient Time For Goal Achievement: 08/13/18 Potential to Achieve Goals: Good Progress towards PT goals: Progressing toward goals    Frequency    Min 5X/week      PT Plan Current plan remains appropriate    Co-evaluation  AM-PAC PT "6 Clicks" Daily Activity  Outcome Measure  Difficulty turning over in bed (including adjusting bedclothes, sheets and blankets)?: A Little Difficulty moving from lying on back to sitting on the side of the bed? : A Little Difficulty sitting down on and standing up from a chair with arms (e.g., wheelchair, bedside commode, etc,.)?: Unable Help needed moving to and from a bed to chair (including a wheelchair)?: A Little Help needed walking in hospital room?: A Little Help needed climbing 3-5 steps with a railing? : A Little 6 Click Score: 16    End of Session Equipment Utilized  During Treatment: Gait belt;Back brace Activity Tolerance: Patient tolerated treatment well Patient left: in chair;with call bell/phone within reach Nurse Communication: Mobility status PT Visit Diagnosis: Pain;Other abnormalities of gait and mobility (R26.89) Pain - part of body: (back)     Time: 2426-8341 PT Time Calculation (min) (ACUTE ONLY): 16 min  Charges:  $Gait Training: 8-22 mins                     Alben Deeds, PT DPT  Board Certified Neurologic Specialist [place] Pager [phone] Office [place] 08/13/2018, 7:22 AM

## 2018-09-08 ENCOUNTER — Encounter: Payer: Self-pay | Admitting: *Deleted

## 2018-09-08 MED ORDER — POLYETHYLENE GLYCOL 3350 17 GM/SCOOP PO POWD: ORAL | 0 refills | Status: DC

## 2018-09-08 NOTE — Progress Notes (Signed)
Patient has been scheduled for a colonoscopy on 10-26-18 at [facility]. Miralax prescription has been sent in to the patient's pharmacy today. Colonoscopy instructions to be reviewed with the patient at pre-op visit on 09-27-18. This patient is aware to call the office if they have further questions.

## 2018-09-19 ENCOUNTER — Encounter: Payer: Self-pay | Admitting: General Surgery

## 2018-09-27 ENCOUNTER — Other Ambulatory Visit: Payer: Self-pay

## 2018-09-27 ENCOUNTER — Encounter: Payer: Self-pay | Admitting: General Surgery

## 2018-09-27 ENCOUNTER — Ambulatory Visit: Payer: Managed Care, Other (non HMO) | Admitting: General Surgery

## 2018-09-27 ENCOUNTER — Other Ambulatory Visit: Payer: Self-pay | Admitting: General Surgery

## 2018-09-27 VITALS — BP 127/80 | HR 89 | Temp 98.1°F | Resp 18 | Ht 63.0 in | Wt 212.4 lb

## 2018-09-27 DIAGNOSIS — Z800 Family history of malignant neoplasm of digestive organs: Secondary | ICD-10-CM

## 2018-09-27 DIAGNOSIS — K219 Gastro-esophageal reflux disease without esophagitis: Secondary | ICD-10-CM | POA: Diagnosis not present

## 2018-09-27 MED ORDER — AMOXICILLIN 500 MG PO CAPS: ORAL_CAPSULE | ORAL | 0 refills | Status: DC

## 2018-09-27 MED ORDER — OMEPRAZOLE 20 MG PO CPDR: 20.0000 mg | DELAYED_RELEASE_CAPSULE | Freq: Every day | ORAL | 0 refills | Status: DC

## 2018-09-27 NOTE — Progress Notes (Signed)
Patient ID: Melissa Hunt, female   DOB: 08/29/1956, 62 y.o.   MRN: [id number]  Chief Complaint  Patient presents with  . Follow-up    discuss colonoscopy     HPI Labrittany Hunt is a 62 y.o. female.  Here today to discuss Colonoscopy.  For 6 weeks she has had indigestion and vomiting. Continuously belches. Takes tums regularly, 10 a day.  She reports a long history of intermittent vomiting, but has been more severe for the last several weeks.  Intermittent cycles of diarrhea and constipation.  Near lifelong history of same.  Denies blood in stool. Complains of abdominal bloating.  Symptoms significantly increased after her recent lumbar discectomy and fusion.  Neurologic symptoms improving since surgery.   HPI  Past Medical History:  Diagnosis Date  . Anxiety   . Arthritis 2008  . Breast discharge 2 weeks ago   LEFT BREAST BLOODY  . Essential hypertension 04/13/2018  . Fibromyalgia   . Headache   . Hypertension 2000  . Hypothyroidism   . Lump or mass in breast   . Neoplasm of uncertain behavior of breast   . 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 OSA (obstructive sleep apnea)   . Primary localized osteoarthritis of knee    Right  . Primary localized osteoarthritis of left knee 09/06/2017  . Primary localized osteoarthritis of right knee 04/13/2018  . Psoriasis   . S/P total knee arthroplasty, left 04/13/2018    Past Surgical History:  Procedure Laterality Date  . BREAST BIOPSY Left 2014   neg, but took Tamoxifen 1 1/2 yrs  . BREAST BIOPSY Right    many years ago  . BREAST BIOPSY Left 03/12/2017   FIBROCYSTIC CHANGES WITH CALCIFICATIONS.[name]  . BREAST SURGERY Left 2014   breast biopsy/Atypical ductal hyperplasia of breast   . BREAST SURGERY Right 1999   excision of mass  . CESAREAN SECTION  1985  . CHOLECYSTECTOMY  1994  . COLONOSCOPY  09/08/2012   Dr Candace Cruise, normal. 10 year f/u recommended.   . ENDOMETRIAL ABLATION  2006  . EYE SURGERY  Bilateral    lasik eye surgery  . TOTAL KNEE ARTHROPLASTY Left 09/06/2017   Procedure: TOTAL KNEE ARTHROPLASTY;  Surgeon: Elsie Saas, MD;  Location: [facility];  Service: Orthopedics;  Laterality: Left;  . TOTAL KNEE ARTHROPLASTY Right 05/02/2018   Procedure: TOTAL KNEE ARTHROPLASTY;  Surgeon: Elsie Saas, MD;  Location: [facility];  Service: Orthopedics;  Laterality: Right;    Family History  Problem Relation Age of Onset  . Cancer Father 56       colon  . Cancer Paternal Aunt        liver  . Cancer Maternal Grandmother        lung  . Breast cancer Maternal Aunt   . Breast cancer Paternal Aunt     Social History Social History   Tobacco Use  . Smoking status: Never Smoker  . Smokeless tobacco: Never Used  Substance Use Topics  . Alcohol use: Yes    Alcohol/week: 1.0 standard drinks    Types: 1 Glasses of wine per week    Comment: Occasional  . Drug use: No    No Known Allergies  Current Outpatient Medications  Medication Sig Dispense Refill  . acetaminophen (TYLENOL) 500 MG tablet Take 500-1,000 mg by mouth every 6 (six) hours as needed for moderate pain or headache.    Marland Kitchen amoxicillin (AMOXIL) 500 MG capsule Take all 4  one hour prior to Colonoscopy. 4 capsule 0  . calcium elemental as carbonate (TUMS ULTRA 1000) 400 MG chewable tablet Chew 2,000 mg by mouth daily as needed for heartburn.    . clobetasol cream (TEMOVATE) 6.76 % Apply 1 application topically 2 (two) times daily as needed (dermatosis).     . folic acid (FOLVITE) 1 MG tablet Take 1 mg by mouth daily.  11  . gabapentin (NEURONTIN) 100 MG capsule Take 100 mg by mouth at bedtime.   0  . levothyroxine (SYNTHROID, LEVOTHROID) 150 MCG tablet Take 150 mcg by mouth every morning. 30-60 MINS BEFORE BREAKFAST    . methotrexate (RHEUMATREX) 2.5 MG tablet Take 20 mg by mouth every Monday.   1  . olmesartan (BENICAR) 20 MG tablet Take 10 mg by mouth at bedtime.    Marland Kitchen omeprazole (PRILOSEC) 20 MG capsule Take 1 capsule (20 mg  total) by mouth daily. 30 capsule 0  . polyethylene glycol (MIRALAX / GLYCOLAX) packet 17grams in 6 oz of something to drink twice a day until bowel movement.  LAXITIVE.  Restart if two days since last bowel movement (Patient not taking: Reported on 07/28/2018) 14 each 0  . polyethylene glycol powder (GLYCOLAX/MIRALAX) powder 255 grams one bottle for colonoscopy prep 255 g 0  . traMADol (ULTRAM) 50 MG tablet Take 50 mg by mouth at bedtime.     No current facility-administered medications for this visit.     Review of Systems Review of Systems  Constitutional: Negative.   Respiratory: Negative.   Skin: Negative.     Blood pressure 127/80, pulse 89, temperature 98.1 F (36.7 C), temperature source Temporal, resp. rate 18, height '5\' 3"'  (1.6 m), weight 212 lb 6.4 oz (96.3 kg), SpO2 98 %.  Physical Exam Physical Exam  Constitutional: She is oriented to person, place, and time. She appears well-developed and well-nourished.  Cardiovascular: Normal rate.  Pulmonary/Chest: Effort normal and breath sounds normal.  Neurological: She is alert and oriented to person, place, and time.  Skin: Skin is warm and dry.    Data Reviewed To date attempts to review her colonoscopy from 10 years ago have been unsuccessful.  Assessment    Family history of colon cancer in first-degree relative, age over 15.  Recent sharp increase in reflux, candidate for upper endoscopy.    Plan    The patient has in the past used her CPAP inconsistently.  She is recently been fitted for a new mask and has been encouraged to bring the unit to endoscopy for the procedure.  No indication to discontinue methotrexate at this time.  Pros and cons of upper endoscopy were reviewed and the patient is amenable to proceed.  With her long history of waxing and waning diarrhea (which is described as a single, large volume pudding-like stool with some urgency) and then having hard, "moon rock" stools she may benefit from a  fiber supplement more so than daily MiraLAX.  She will make use of 1 of the myriad of fiber supplements daily and report on her progress at the time of her scheduled endoscopy on December 18.  With her reflux requiring such large amounts of antacids, she will make use of a trial of Prilosec 20 mg daily.  As the patient has recently had hardware placed in her spine and in light of the fact she has bilateral knee replacements she will make use of amoxicillin for 4-500 mg capsules 1 hour prior to the procedure to minimize the risk for  infection.       HPI, Physical Exam, Assessment and Plan have been scribed under the direction and in the presence of Robert Bellow, MD. Jonnie Finner, CMA  [facility]  09/28/2018, 5:34 AM

## 2018-09-27 NOTE — Patient Instructions (Addendum)
You may try fiber supplement daily to help  avoid constipation.  Please pick up your medication at the pharmacy.   High-Fiber Diet Fiber, also called dietary fiber, is a type of carbohydrate found in fruits, vegetables, whole grains, and beans. A high-fiber diet can have many health benefits. Your health care provider may recommend a high-fiber diet to help:  Prevent constipation. Fiber can make your bowel movements more regular.  Lower your cholesterol.  Relieve hemorrhoids, uncomplicated diverticulosis, or irritable bowel syndrome.  Prevent overeating as part of a weight-loss plan.  Prevent heart disease, type 2 diabetes, and certain cancers.  What is my plan? The recommended daily intake of fiber includes:  38 grams for men under age 96.  55 grams for men over age 77.  71 grams for women under age 46.  76 grams for women over age 53.  You can get the recommended daily intake of dietary fiber by eating a variety of fruits, vegetables, grains, and beans. Your health care provider may also recommend a fiber supplement if it is not possible to get enough fiber through your diet. What do I need to know about a high-fiber diet?  Fiber supplements have not been widely studied for their effectiveness, so it is better to get fiber through food sources.  Always check the fiber content on thenutrition facts label of any prepackaged food. Look for foods that contain at least 5 grams of fiber per serving.  Ask your dietitian if you have questions about specific foods that are related to your condition, especially if those foods are not listed in the following section.  Increase your daily fiber consumption gradually. Increasing your intake of dietary fiber too quickly may cause bloating, cramping, or gas.  Drink plenty of water. Water helps you to digest fiber. What foods can I eat? Grains Whole-grain breads. Multigrain cereal. Oats and oatmeal. Brown rice. Barley. Bulgur wheat.  [place]. Bran muffins. Popcorn. Rye wafer crackers. Vegetables Sweet potatoes. Spinach. Kale. Artichokes. Cabbage. Broccoli. Green peas. Carrots. Squash. Fruits Berries. Pears. Apples. Oranges. Avocados. Prunes and raisins. Dried figs. Meats and Other Protein Sources Navy, kidney, pinto, and soy beans. Split peas. Lentils. Nuts and seeds. Dairy Fiber-fortified yogurt. Beverages Fiber-fortified soy milk. Fiber-fortified orange juice. Other Fiber bars. The items listed above may not be a complete list of recommended foods or beverages. Contact your dietitian for more options. What foods are not recommended? Grains White bread. Pasta made with refined flour. White rice. Vegetables Fried potatoes. Canned vegetables. Well-cooked vegetables. Fruits Fruit juice. Cooked, strained fruit. Meats and Other Protein Sources Fatty cuts of meat. Fried Sales executive or fried fish. Dairy Milk. Yogurt. Cream cheese. Sour cream. Beverages Soft drinks. Other Cakes and pastries. Butter and oils. The items listed above may not be a complete list of foods and beverages to avoid. Contact your dietitian for more information. What are some tips for including high-fiber foods in my diet?  Eat a wide variety of high-fiber foods.  Make sure that half of all grains consumed each day are whole grains.  Replace breads and cereals made from refined flour or white flour with whole-grain breads and cereals.  Replace white rice with brown rice, bulgur wheat, or millet.  Start the day with a breakfast that is high in fiber, such as a cereal that contains at least 5 grams of fiber per serving.  Use beans in place of meat in soups, salads, or pasta.  Eat high-fiber snacks, such as berries, raw vegetables, nuts, or  popcorn. This information is not intended to replace advice given to you by your health care provider. Make sure you discuss any questions you have with your health care provider. Document Released:  10/26/2005 Document Revised: 04/02/2016 Document Reviewed: 04/10/2014 Elsevier Interactive Patient Education  [employer].   Patient is aware to bring her CPAP to her colonoscopy.

## 2018-09-28 DIAGNOSIS — K219 Gastro-esophageal reflux disease without esophagitis: Secondary | ICD-10-CM | POA: Insufficient documentation

## 2018-10-05 DIAGNOSIS — M25571 Pain in right ankle and joints of right foot: Secondary | ICD-10-CM

## 2018-10-05 DIAGNOSIS — M25572 Pain in left ankle and joints of left foot: Secondary | ICD-10-CM

## 2018-10-05 DIAGNOSIS — G8929 Other chronic pain: Secondary | ICD-10-CM | POA: Insufficient documentation

## 2018-10-05 DIAGNOSIS — R4189 Other symptoms and signs involving cognitive functions and awareness: Secondary | ICD-10-CM | POA: Insufficient documentation

## 2018-10-22 ENCOUNTER — Other Ambulatory Visit: Payer: Self-pay | Admitting: General Surgery

## 2018-10-26 ENCOUNTER — Ambulatory Visit: Payer: Managed Care, Other (non HMO) | Admitting: Certified Registered Nurse Anesthetist

## 2018-10-26 ENCOUNTER — Encounter
Admission: RE | Disposition: A | Discharge status: Home / Self Care | Payer: Self-pay | Source: Home / Self Care | Attending: General Surgery

## 2018-10-26 ENCOUNTER — Ambulatory Visit
Admission: RE | Admit: 2018-10-26 | Discharge: 2018-10-26 | Disposition: A | Discharge status: Home / Self Care | Payer: Managed Care, Other (non HMO) | Attending: General Surgery | Admitting: General Surgery

## 2018-10-26 ENCOUNTER — Encounter: Payer: Self-pay | Admitting: *Deleted

## 2018-10-26 DIAGNOSIS — I10 Essential (primary) hypertension: Secondary | ICD-10-CM | POA: Insufficient documentation

## 2018-10-26 DIAGNOSIS — M797 Fibromyalgia: Secondary | ICD-10-CM | POA: Insufficient documentation

## 2018-10-26 DIAGNOSIS — R12 Heartburn: Secondary | ICD-10-CM | POA: Insufficient documentation

## 2018-10-26 DIAGNOSIS — E039 Hypothyroidism, unspecified: Secondary | ICD-10-CM | POA: Insufficient documentation

## 2018-10-26 DIAGNOSIS — K449 Diaphragmatic hernia without obstruction or gangrene: Secondary | ICD-10-CM | POA: Diagnosis not present

## 2018-10-26 DIAGNOSIS — Z79891 Long term (current) use of opiate analgesic: Secondary | ICD-10-CM | POA: Diagnosis not present

## 2018-10-26 DIAGNOSIS — L409 Psoriasis, unspecified: Secondary | ICD-10-CM | POA: Insufficient documentation

## 2018-10-26 DIAGNOSIS — Z79899 Other long term (current) drug therapy: Secondary | ICD-10-CM | POA: Insufficient documentation

## 2018-10-26 DIAGNOSIS — Z800 Family history of malignant neoplasm of digestive organs: Secondary | ICD-10-CM

## 2018-10-26 DIAGNOSIS — R197 Diarrhea, unspecified: Secondary | ICD-10-CM | POA: Insufficient documentation

## 2018-10-26 DIAGNOSIS — Z96653 Presence of artificial knee joint, bilateral: Secondary | ICD-10-CM | POA: Diagnosis not present

## 2018-10-26 DIAGNOSIS — K219 Gastro-esophageal reflux disease without esophagitis: Secondary | ICD-10-CM | POA: Diagnosis not present

## 2018-10-26 DIAGNOSIS — Z79890 Hormone replacement therapy (postmenopausal): Secondary | ICD-10-CM | POA: Insufficient documentation

## 2018-10-26 HISTORY — PX: COLONOSCOPY WITH PROPOFOL: SHX5780

## 2018-10-26 HISTORY — PX: ESOPHAGOGASTRODUODENOSCOPY (EGD) WITH PROPOFOL: SHX5813

## 2018-10-26 SURGERY — COLONOSCOPY WITH PROPOFOL
Anesthesia: General

## 2018-10-26 MED ORDER — PROPOFOL 500 MG/50ML IV EMUL
INTRAVENOUS | Status: AC
  Filled 2018-10-26: qty 50

## 2018-10-26 MED ORDER — LIDOCAINE HCL (CARDIAC) PF 100 MG/5ML IV SOSY
PREFILLED_SYRINGE | INTRAVENOUS | Status: DC | PRN
  Administered 2018-10-26: 100 mg via INTRAVENOUS

## 2018-10-26 MED ORDER — PROPOFOL 10 MG/ML IV BOLUS
INTRAVENOUS | Status: DC | PRN
  Administered 2018-10-26: 60 mg via INTRAVENOUS
  Administered 2018-10-26: 20 mg via INTRAVENOUS
  Administered 2018-10-26: 10 mg via INTRAVENOUS

## 2018-10-26 MED ORDER — PROPOFOL 10 MG/ML IV BOLUS
INTRAVENOUS | Status: AC
  Filled 2018-10-26: qty 20

## 2018-10-26 MED ORDER — SODIUM CHLORIDE 0.9 % IV SOLN
INTRAVENOUS | Status: DC
  Administered 2018-10-26: 08:00:00 via INTRAVENOUS

## 2018-10-26 MED ORDER — PROPOFOL 500 MG/50ML IV EMUL
INTRAVENOUS | Status: DC | PRN
  Administered 2018-10-26: 175 ug/kg/min via INTRAVENOUS

## 2018-10-26 NOTE — Op Note (Signed)
[facility] Gastroenterology Patient Name: Melissa Hunt Procedure Date: 10/26/2018 8:08 AM MRN: [id number] Account #: [id number] Date of Birth: 03/03/1956 Admit Type: Outpatient Age: 62 Room: [facility] ENDO ROOM 1 Gender: Female Note Status: Finalized Procedure:            Upper GI endoscopy Indications:          Heartburn, Diarrhea Providers:            Robert Bellow, MD Referring MD:         Rusty Aus, MD (Referring MD) Medicines:            Monitored Anesthesia Care Complications:        No immediate complications. Procedure:            Pre-Anesthesia Assessment:                       - Prior to the procedure, a History and Physical was                        performed, and patient medications, allergies and                        sensitivities were reviewed. The patient's tolerance of                        previous anesthesia was reviewed.                       - The risks and benefits of the procedure and the                        sedation options and risks were discussed with the                        patient. All questions were answered and informed                        consent was obtained.                       After obtaining informed consent, the endoscope was                        passed under direct vision. Throughout the procedure,                        the patient's blood pressure, pulse, and oxygen                        saturations were monitored continuously. The Endoscope                        was introduced through the mouth, and advanced to the                        fourth part of duodenum. The upper GI endoscopy was                        accomplished without difficulty. The patient tolerated  the procedure well. Findings:      A small hiatal hernia was present. This was biopsied with a cold forceps       for histology.      The stomach was normal.      The examined duodenum was normal. Impression:            - Small hiatal hernia. Biopsied.                       - Normal stomach.                       - Normal examined duodenum. Recommendation:       - Telephone endoscopist for pathology results in 1 week. Procedure Code(s):    --- Professional ---                       [phone], Esophagogastroduodenoscopy, flexible, transoral;                        with biopsy, single or multiple Diagnosis Code(s):    --- Professional ---                       K44.9, Diaphragmatic hernia without obstruction or                        gangrene                       R12, Heartburn                       R19.7, Diarrhea, unspecified CPT copyright 2018 American Medical Association. All rights reserved. The codes documented in this report are preliminary and upon coder review may  be revised to meet current compliance requirements. Robert Bellow, MD 10/26/2018 8:26:46 AM This report has been signed electronically. Number of Addenda: 0 Note Initiated On: 10/26/2018 8:08 AM      [facility]

## 2018-10-26 NOTE — Anesthesia Preprocedure Evaluation (Signed)
Anesthesia Evaluation  Patient identified by MRN, date of birth, ID band Patient awake    Reviewed: Allergy & Precautions, NPO status , Patient's Chart, lab work & pertinent test results  History of Anesthesia Complications Negative for: history of anesthetic complications  Airway Mallampati: II  TM Distance: >3 FB Neck ROM: Full    Dental no notable dental hx.    Pulmonary sleep apnea , neg COPD,    breath sounds clear to auscultation- rhonchi (-) wheezing      Cardiovascular hypertension, Pt. on medications (-) CAD, (-) Past MI, (-) Cardiac Stents and (-) CABG  Rhythm:Regular Rate:Normal - Systolic murmurs and - Diastolic murmurs    Neuro/Psych  Headaches, neg Seizures    GI/Hepatic Neg liver ROS, GERD  ,  Endo/Other  neg diabetesHypothyroidism   Renal/GU      Musculoskeletal  (+) Arthritis , Fibromyalgia -  Abdominal (+) + obese,   Peds  Hematology negative hematology ROS (+)   Anesthesia Other Findings Past Medical History: No date: Anxiety 2008: Arthritis 2 weeks ago: Breast discharge     Comment:  LEFT BREAST BLOODY 04/13/2018: Essential hypertension No date: Fibromyalgia No date: Headache 2000: Hypertension No date: Hypothyroidism No date: Lump or mass in breast No 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 No date: Neuromuscular disorder ([place])     Comment:  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 No date: OSA (obstructive sleep apnea) No date: Primary localized osteoarthritis of knee     Comment:  Right 09/06/2017: Primary localized osteoarthritis of left knee 04/13/2018: Primary localized osteoarthritis of right knee No date: Psoriasis 04/13/2018: S/P total knee arthroplasty, left   Reproductive/Obstetrics                            Anesthesia Physical Anesthesia Plan  ASA: II  Anesthesia Plan: General   Post-op Pain Management:    Induction: Intravenous  PONV Risk Score and  Plan: 2 and Propofol infusion  Airway Management Planned: Natural Airway  Additional Equipment:   Intra-op Plan:   Post-operative Plan:   Informed Consent: I have reviewed the patients History and Physical, chart, labs and discussed the procedure including the risks, benefits and alternatives for the proposed anesthesia with the patient or authorized representative who has indicated his/her understanding and acceptance.   Dental advisory given  Plan Discussed with: CRNA and Anesthesiologist  Anesthesia Plan Comments:         Anesthesia Quick Evaluation

## 2018-10-26 NOTE — Op Note (Signed)
[facility] Gastroenterology Patient Name: Melissa Hunt Procedure Date: 10/26/2018 8:07 AM MRN: [id number] Account #: [id number] Date of Birth: [birth year] Admit Type: Outpatient Age: 62 Room: [facility] ENDO ROOM 1 Gender: Female Note Status: Finalized Procedure:            Colonoscopy Indications:          Clinically significant diarrhea of unexplained origin Providers:            Robert Bellow, MD Referring MD:         Rusty Aus, MD (Referring MD) Medicines:            Monitored Anesthesia Care Complications:        No immediate complications. Procedure:            Pre-Anesthesia Assessment:                       - The risks and benefits of the procedure and the                        sedation options and risks were discussed with the                        patient. All questions were answered and informed                        consent was obtained.                       - Prior to the procedure, a History and Physical was                        performed, and patient medications, allergies and                        sensitivities were reviewed. The patient's tolerance of                        previous anesthesia was reviewed.                       After obtaining informed consent, the colonoscope was                        passed under direct vision. Throughout the procedure,                        the patient's blood pressure, pulse, and oxygen                        saturations were monitored continuously. The                        Colonoscope was introduced through the anus and                        advanced to the the terminal ileum. The colonoscopy was                        performed without difficulty. The patient tolerated the  procedure well. The quality of the bowel preparation                        was excellent. Findings:      The entire examined colon appeared normal on direct and retroflexion       views.      The  terminal ileum appeared normal. Impression:           - The entire examined colon is normal on direct and                        retroflexion views.                       - The examined portion of the ileum was normal.                       - No specimens collected. Recommendation:       - Telephone endoscopist for pathology results in 1 week. Procedure Code(s):    --- Professional ---                       [phone], Colonoscopy, flexible; diagnostic, including                        collection of specimen(s) by brushing or washing, when                        performed (separate procedure) Diagnosis Code(s):    --- Professional ---                       R19.7, Diarrhea, unspecified CPT copyright 2018 American Medical Association. All rights reserved. The codes documented in this report are preliminary and upon coder review may  be revised to meet current compliance requirements. Robert Bellow, MD 10/26/2018 8:49:14 AM This report has been signed electronically. Number of Addenda: 0 Note Initiated On: 10/26/2018 8:07 AM Scope Withdrawal Time: 0 hours 10 minutes 12 seconds  Total Procedure Duration: 0 hours 17 minutes 10 seconds       [facility]

## 2018-10-26 NOTE — Anesthesia Procedure Notes (Signed)
Date/Time: 10/26/2018 8:09 AM Performed by: Johnna Acosta, CRNA Pre-anesthesia Checklist: Patient identified, Emergency Drugs available, Suction available, Patient being monitored and Timeout performed Patient Re-evaluated:Patient Re-evaluated prior to induction Oxygen Delivery Method: Nasal cannula Preoxygenation: Pre-oxygenation with 100% oxygen Induction Type: IV induction

## 2018-10-26 NOTE — Anesthesia Postprocedure Evaluation (Signed)
Anesthesia Post Note  Patient: Melissa Hunt  Procedure(s) Performed: COLONOSCOPY WITH PROPOFOL (N/A ) ESOPHAGOGASTRODUODENOSCOPY (EGD) WITH PROPOFOL (N/A )  Patient location during evaluation: Endoscopy Anesthesia Type: General Level of consciousness: awake and alert and oriented Pain management: pain level controlled Vital Signs Assessment: post-procedure vital signs reviewed and stable Respiratory status: spontaneous breathing, nonlabored ventilation and respiratory function stable Cardiovascular status: blood pressure returned to baseline and stable Postop Assessment: no signs of nausea or vomiting Anesthetic complications: no     Last Vitals:  Vitals:   10/26/18 0903 10/26/18 0913  BP: 97/72 121/80  Pulse: 79 71  Resp: 20 17  Temp:    SpO2: 99% 100%    Last Pain:  Vitals:   10/26/18 0913  TempSrc:   PainSc: 0-No pain                 Melissa Hunt

## 2018-10-26 NOTE — Anesthesia Post-op Follow-up Note (Signed)
Anesthesia QCDR form completed.        

## 2018-10-26 NOTE — Transfer of Care (Signed)
Immediate Anesthesia Transfer of Care Note  Patient: Melissa Hunt  Procedure(s) Performed: COLONOSCOPY WITH PROPOFOL (N/A ) ESOPHAGOGASTRODUODENOSCOPY (EGD) WITH PROPOFOL (N/A )  Patient Location: PACU  Anesthesia Type:General  Level of Consciousness: awake and alert   Airway & Oxygen Therapy: Patient Spontanous Breathing and Patient connected to nasal cannula oxygen  Post-op Assessment: Report given to RN and Post -op Vital signs reviewed and stable  Post vital signs: Reviewed and stable  Last Vitals:  Vitals Value Taken Time  BP 98/46 10/26/2018  8:53 AM  Temp 36.2 C 10/26/2018  8:53 AM  Pulse 81 10/26/2018  8:53 AM  Resp 13 10/26/2018  8:53 AM  SpO2 99 % 10/26/2018  8:53 AM    Last Pain:  Vitals:   10/26/18 0853  TempSrc: Tympanic         Complications: No apparent anesthesia complications

## 2018-10-26 NOTE — H&P (Signed)
Melissa Hunt [id number] [birth year]     HPI:  No change in clinical history or exam. Tolerated prep well.   Medications Prior to Admission  Medication Sig Dispense Refill Last Dose  . amoxicillin (AMOXIL) 500 MG capsule Take all 4 one hour prior to Colonoscopy. 4 capsule 0 10/26/2018 at Unknown time  . levothyroxine (SYNTHROID, LEVOTHROID) 150 MCG tablet Take 150 mcg by mouth every morning. 30-60 MINS BEFORE BREAKFAST   10/25/2018 at Unknown time  . methotrexate (RHEUMATREX) 2.5 MG tablet Take 20 mg by mouth every Monday.   1 Past Week at Unknown time  . omeprazole (PRILOSEC) 20 MG capsule TAKE 1 CAPSULE(20 MG) BY MOUTH DAILY 30 capsule 3 10/25/2018 at Unknown time  . traMADol (ULTRAM) 50 MG tablet Take 50 mg by mouth at bedtime.   Past Week at Unknown time  . acetaminophen (TYLENOL) 500 MG tablet Take 500-1,000 mg by mouth every 6 (six) hours as needed for moderate pain or headache.   Past Week at Unknown time  . calcium elemental as carbonate (TUMS ULTRA 1000) 400 MG chewable tablet Chew 2,000 mg by mouth daily as needed for heartburn.   08/09/2018 at Unknown time  . clobetasol cream (TEMOVATE) 1.44 % Apply 1 application topically 2 (two) times daily as needed (dermatosis).    More than a month at Unknown time  . folic acid (FOLVITE) 1 MG tablet Take 1 mg by mouth daily.  11 08/09/2018 at Unknown time  . gabapentin (NEURONTIN) 100 MG capsule Take 100 mg by mouth at bedtime.   0 08/09/2018 at Unknown time  . olmesartan (BENICAR) 20 MG tablet Take 10 mg by mouth at bedtime.   08/09/2018 at Unknown time  . polyethylene glycol (MIRALAX / GLYCOLAX) packet 17grams in 6 oz of something to drink twice a day until bowel movement.  LAXITIVE.  Restart if two days since last bowel movement (Patient not taking: Reported on 07/28/2018) 14 each 0 Completed Course at Unknown time  . polyethylene glycol powder (GLYCOLAX/MIRALAX) powder 255 grams one bottle for colonoscopy prep 255 g 0    No Known  Allergies Past Medical History:  Diagnosis Date  . Anxiety   . Arthritis 2008  . Breast discharge 2 weeks ago   LEFT BREAST BLOODY  . Essential hypertension 04/13/2018  . Fibromyalgia   . Headache   . Hypertension 2000  . Hypothyroidism   . Lump or mass in breast   . Neoplasm of uncertain behavior of breast   . Neuromuscular disorder ([place])    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  . OSA (obstructive sleep apnea)   . Primary localized osteoarthritis of knee    Right  . Primary localized osteoarthritis of left knee 09/06/2017  . Primary localized osteoarthritis of right knee 04/13/2018  . Psoriasis   . S/P total knee arthroplasty, left 04/13/2018   Past Surgical History:  Procedure Laterality Date  . BREAST BIOPSY Left 2014   neg, but took Tamoxifen 1 1/2 yrs  . BREAST BIOPSY Right    many years ago  . BREAST BIOPSY Left 03/12/2017   FIBROCYSTIC CHANGES WITH CALCIFICATIONS.[place]  . BREAST SURGERY Left 2014   breast biopsy/Atypical ductal hyperplasia of breast   . BREAST SURGERY Right 1999   excision of mass  . CESAREAN SECTION  1985  . CHOLECYSTECTOMY  1994  . COLONOSCOPY  09/08/2012   Dr Candace Cruise, normal. 10 year f/u recommended.   . ENDOMETRIAL ABLATION  2006  . EYE SURGERY Bilateral  lasik eye surgery  . TOTAL KNEE ARTHROPLASTY Left 09/06/2017   Procedure: TOTAL KNEE ARTHROPLASTY;  Surgeon: Elsie Saas, MD;  Location: [place];  Service: Orthopedics;  Laterality: Left;  . TOTAL KNEE ARTHROPLASTY Right 05/02/2018   Procedure: TOTAL KNEE ARTHROPLASTY;  Surgeon: Elsie Saas, MD;  Location: [place];  Service: Orthopedics;  Laterality: Right;   Social History   Socioeconomic History  . Marital status: Married    Spouse name: Not on file  . Number of children: Not on file  . Years of education: Not on file  . Highest education level: Not on file  Occupational History  . Not on file  Social Needs  . Financial resource strain: Not on file  . Food insecurity:    Worry: Not on file     Inability: Not on file  . Transportation needs:    Medical: Not on file    Non-medical: Not on file  Tobacco Use  . Smoking status: Never Smoker  . Smokeless tobacco: Never Used  Substance and Sexual Activity  . Alcohol use: Yes    Alcohol/week: 1.0 standard drinks    Types: 1 Glasses of wine per week    Comment: Occasional  . Drug use: No  . Sexual activity: Yes  Lifestyle  . Physical activity:    Days per week: Not on file    Minutes per session: Not on file  . Stress: Not on file  Relationships  . Social connections:    Talks on phone: Not on file    Gets together: Not on file    Attends religious service: Not on file    Active member of club or organization: Not on file    Attends meetings of clubs or organizations: Not on file    Relationship status: Not on file  . Intimate partner violence:    Fear of current or ex partner: Not on file    Emotionally abused: Not on file    Physically abused: Not on file    Forced sexual activity: Not on file  Other Topics Concern  . Not on file  Social History Narrative  . Not on file   Social History   Social History Narrative  . Not on file     ROS: Negative.     PE: HEENT: Negative. Lungs: Clear. Cardio: RR.  Assessment/Plan:  Proceed with planned upper and lower endoscopy.  Forest Gleason [facility] 10/26/2018

## 2018-10-28 ENCOUNTER — Encounter: Payer: Self-pay | Admitting: Podiatry

## 2018-10-28 ENCOUNTER — Ambulatory Visit (INDEPENDENT_AMBULATORY_CARE_PROVIDER_SITE_OTHER): Payer: Managed Care, Other (non HMO) | Admitting: Podiatry

## 2018-10-28 ENCOUNTER — Ambulatory Visit (INDEPENDENT_AMBULATORY_CARE_PROVIDER_SITE_OTHER): Payer: Managed Care, Other (non HMO)

## 2018-10-28 ENCOUNTER — Other Ambulatory Visit: Payer: Self-pay | Admitting: Podiatry

## 2018-10-28 DIAGNOSIS — L989 Disorder of the skin and subcutaneous tissue, unspecified: Secondary | ICD-10-CM | POA: Diagnosis not present

## 2018-10-28 DIAGNOSIS — M722 Plantar fascial fibromatosis: Secondary | ICD-10-CM | POA: Diagnosis not present

## 2018-10-28 DIAGNOSIS — M659 Synovitis and tenosynovitis, unspecified: Secondary | ICD-10-CM | POA: Diagnosis not present

## 2018-10-28 DIAGNOSIS — M79671 Pain in right foot: Secondary | ICD-10-CM

## 2018-10-28 DIAGNOSIS — M79672 Pain in left foot: Principal | ICD-10-CM

## 2018-10-30 NOTE — Progress Notes (Signed)
   Subjective: 62 year old female presenting today with a chief complaint of bilateral ankle pain that began a few months ago. She reports associated sharp pain when stepping off of something. Walking for long periods of time increases the pain. She has not done anything for treatment. She also states she is interested in new orthotics for recurrent plantar fasciitis. Patient is here for further evaluation and treatment.   Past Medical History:  Diagnosis Date  . Anxiety   . Arthritis 2008  . Breast discharge 2 weeks ago   LEFT BREAST BLOODY  . Essential hypertension 04/13/2018  . Fibromyalgia   . Headache   . Hypertension 2000  . Hypothyroidism   . Lump or mass in breast   . Neoplasm of uncertain behavior of breast   . 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 OSA (obstructive sleep apnea)   . Primary localized osteoarthritis of knee    Right  . Primary localized osteoarthritis of left knee 09/06/2017  . Primary localized osteoarthritis of right knee 04/13/2018  . Psoriasis   . S/P total knee arthroplasty, left 04/13/2018     Objective:  Physical Exam General: Alert and oriented x3 in no acute distress  Dermatology: Hyperkeratotic lesions present on the bilateral feet x 3. Pain on palpation with a central nucleated core noted. Skin is warm, dry and supple bilateral lower extremities. Negative for open lesions or macerations.  Vascular: Palpable pedal pulses bilaterally. No edema or erythema noted. Capillary refill within normal limits.  Neurological: Epicritic and protective threshold grossly intact bilaterally.   Musculoskeletal Exam: Pain on palpation at the keratotic lesion noted. Pain with palpation noted to the medial, anterior and lateral aspects of the ankles bilaterally. Range of motion within normal limits bilateral. Muscle strength 5/5 in all groups bilateral.  Radiographic Exam:  Normal osseous mineralization. Joint spaces preserved. No  fracture/dislocation/boney destruction.    Assessment: 1. Ankle synovitis bilateral 2. 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 Patient evaluated. X-Rays reviewed.  2. Excisional debridement of keratoic lesion using a chisel blade was performed without incident.  3. Dressed area with light dressing. 4. Injection of 0.5 mLs Celestone Soluspan injected into the bilateral ankle joints.  5. Appointment with Liliane Channel, Pedorthist, for custom molded orthotics.  6. Patient is to return to the clinic PRN.   Edrick Kins, DPM [facility]  Dr. Edrick Kins, [place]                                        [place], [place] [address]                Office [phone]  Fax [phone]

## 2018-10-31 LAB — SURGICAL PATHOLOGY

## 2018-11-10 ENCOUNTER — Telehealth: Payer: Self-pay | Admitting: *Deleted

## 2018-11-10 ENCOUNTER — Encounter: Payer: Self-pay | Admitting: *Deleted

## 2018-11-10 NOTE — Telephone Encounter (Signed)
Mailbox is full, will send Mychart message. Colonoscopy biopsy results are ok per Dr Rosana Hoes, place in recalls for 10 yrs.

## 2018-11-23 ENCOUNTER — Ambulatory Visit: Payer: Self-pay | Admitting: Orthotics

## 2018-11-23 DIAGNOSIS — M659 Synovitis and tenosynovitis, unspecified: Secondary | ICD-10-CM

## 2018-11-23 NOTE — Progress Notes (Signed)
Patient came in today to pick up custom made foot orthotics.  The goals were accomplished and the patient reported no dissatisfaction with said orthotics.  Patient was advised of breakin period and how to report any issues. 

## 2018-12-05 ENCOUNTER — Telehealth: Payer: Self-pay | Admitting: *Deleted

## 2018-12-05 NOTE — Telephone Encounter (Signed)
-----   Message from Robert Bellow, MD sent at 12/05/2018  9:21 AM EST ----- I  had asked her to make use of a trial of a fiber supplement in November, but did not hear if this had resolved her diarrhea/ constipation problem. If she is doing fine, no f/u required. If not, see if the patient would be amenable for a [place] post endoscopy visit to assess her bowel habits. I

## 2018-12-05 NOTE — Telephone Encounter (Signed)
Message left for patient to call the office.

## 2018-12-06 DIAGNOSIS — G43019 Migraine without aura, intractable, without status migrainosus: Secondary | ICD-10-CM | POA: Diagnosis not present

## 2018-12-06 DIAGNOSIS — R2689 Other abnormalities of gait and mobility: Secondary | ICD-10-CM | POA: Diagnosis not present

## 2018-12-07 ENCOUNTER — Telehealth: Payer: Self-pay | Admitting: *Deleted

## 2018-12-07 NOTE — Telephone Encounter (Signed)
Patient called the office back and states that she is improved.   No follow up appointment needed at this time.   Patient instructed to call the office if we can be of further assistance.   Note routed to Dr. Bary Castilla.

## 2019-01-05 DIAGNOSIS — M47816 Spondylosis without myelopathy or radiculopathy, lumbar region: Secondary | ICD-10-CM | POA: Diagnosis not present

## 2019-01-05 DIAGNOSIS — L409 Psoriasis, unspecified: Secondary | ICD-10-CM | POA: Diagnosis not present

## 2019-01-05 DIAGNOSIS — R05 Cough: Secondary | ICD-10-CM | POA: Diagnosis not present

## 2019-01-05 DIAGNOSIS — L4050 Arthropathic psoriasis, unspecified: Secondary | ICD-10-CM | POA: Diagnosis not present

## 2019-01-24 DIAGNOSIS — I10 Essential (primary) hypertension: Secondary | ICD-10-CM | POA: Diagnosis not present

## 2019-01-24 DIAGNOSIS — Z6841 Body Mass Index (BMI) 40.0 and over, adult: Secondary | ICD-10-CM | POA: Diagnosis not present

## 2019-01-24 DIAGNOSIS — M4316 Spondylolisthesis, lumbar region: Secondary | ICD-10-CM | POA: Diagnosis not present

## 2019-01-26 DIAGNOSIS — L4050 Arthropathic psoriasis, unspecified: Secondary | ICD-10-CM | POA: Diagnosis not present

## 2019-01-26 DIAGNOSIS — M4302 Spondylolysis, cervical region: Secondary | ICD-10-CM | POA: Diagnosis not present

## 2020-09-11 IMAGING — CR DG LUMBAR SPINE 1V
1 series · 1 of 1 positions shown · non-contrast
Comparison: Lumbar spine radiographs 03/08/2018

CLINICAL DATA: Localization film.

EXAM:
LUMBAR SPINE - 1 VIEW

[lateral]
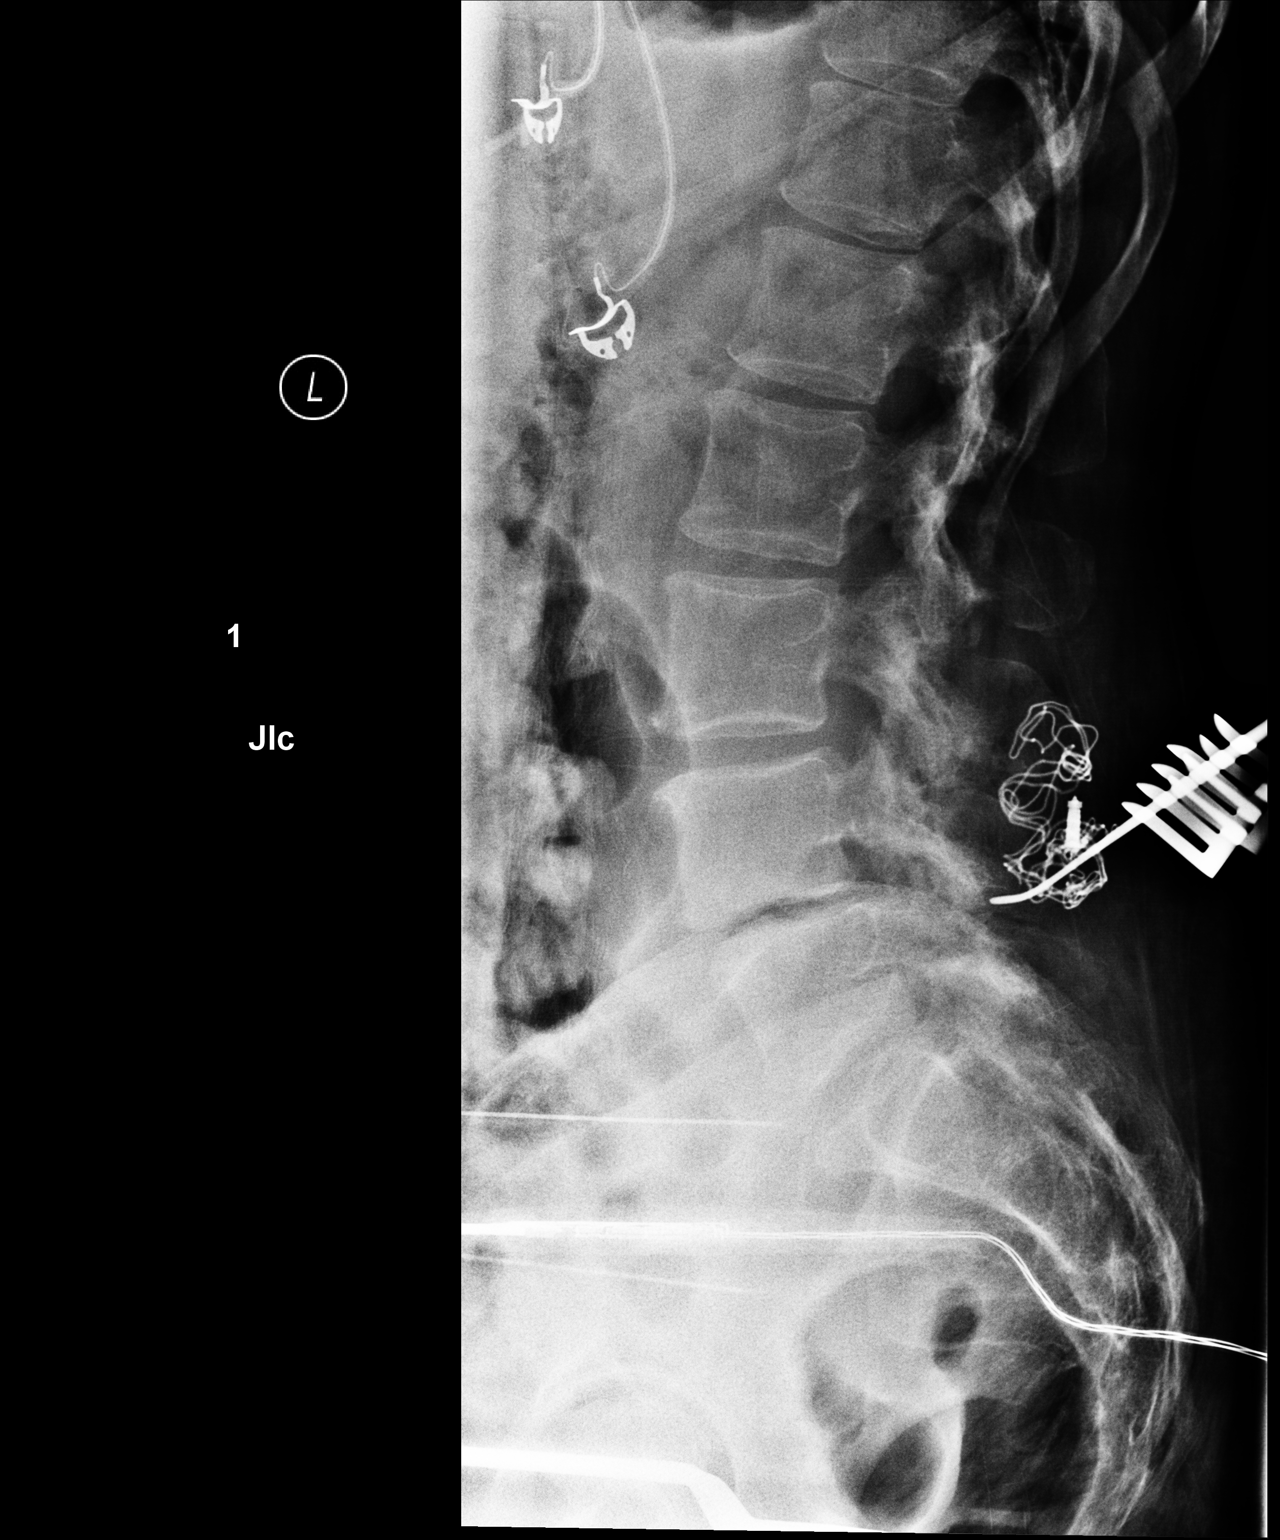

[1 of 1 positions shown; findings below may reference images not displayed]

FINDINGS: A single intraoperative view of the lumbar spine is submitted. A
surgical probe is at the L4 spinous process, at the L4-5 interspace
level.
IMPRESSION: Intraoperative localization of L4-5.
